# Patient Record
Sex: Female | Born: 1975 | Race: White | Hispanic: Yes | Marital: Single | Smoking: Never smoker
Health system: Southern US, Community
[De-identification: ages and names within clinical notes are randomized; demographics above are authoritative.]

## PROBLEM LIST (undated history)

## (undated) DIAGNOSIS — N200 Calculus of kidney: Secondary | ICD-10-CM

## (undated) DIAGNOSIS — O009 Unspecified ectopic pregnancy without intrauterine pregnancy: Secondary | ICD-10-CM

## (undated) HISTORY — PX: OTHER SURGICAL HISTORY: SHX169

---

## 2001-06-12 ENCOUNTER — Inpatient Hospital Stay (HOSPITAL_COMMUNITY): Admission: AD | Admit: 2001-06-12 | Discharge: 2001-06-13 | Payer: Self-pay | Admitting: Obstetrics and Gynecology

## 2001-06-19 ENCOUNTER — Inpatient Hospital Stay (HOSPITAL_COMMUNITY): Admission: AD | Admit: 2001-06-19 | Discharge: 2001-06-19 | Payer: Self-pay | Admitting: *Deleted

## 2001-06-26 ENCOUNTER — Encounter: Admission: RE | Admit: 2001-06-26 | Discharge: 2001-06-26 | Payer: Self-pay | Admitting: *Deleted

## 2003-08-28 ENCOUNTER — Inpatient Hospital Stay (HOSPITAL_COMMUNITY): Admission: AD | Admit: 2003-08-28 | Discharge: 2003-08-28 | Payer: Self-pay | Admitting: Gynecology

## 2003-11-04 ENCOUNTER — Ambulatory Visit (HOSPITAL_COMMUNITY): Admission: RE | Admit: 2003-11-04 | Discharge: 2003-11-04 | Payer: Self-pay | Admitting: *Deleted

## 2004-03-22 ENCOUNTER — Ambulatory Visit: Payer: Self-pay | Admitting: Family Medicine

## 2004-03-22 ENCOUNTER — Inpatient Hospital Stay (HOSPITAL_COMMUNITY): Admission: AD | Admit: 2004-03-22 | Discharge: 2004-03-24 | Payer: Self-pay | Admitting: *Deleted

## 2014-04-07 ENCOUNTER — Emergency Department (HOSPITAL_COMMUNITY)
Admission: EM | Admit: 2014-04-07 | Discharge: 2014-04-08 | Disposition: A | Discharge status: Home / Self Care | Payer: Self-pay | Attending: Emergency Medicine | Admitting: Emergency Medicine

## 2014-04-07 ENCOUNTER — Encounter (HOSPITAL_COMMUNITY): Payer: Self-pay | Admitting: Emergency Medicine

## 2014-04-07 DIAGNOSIS — Y998 Other external cause status: Secondary | ICD-10-CM | POA: Insufficient documentation

## 2014-04-07 DIAGNOSIS — Y9389 Activity, other specified: Secondary | ICD-10-CM | POA: Insufficient documentation

## 2014-04-07 DIAGNOSIS — Z87442 Personal history of urinary calculi: Secondary | ICD-10-CM | POA: Insufficient documentation

## 2014-04-07 DIAGNOSIS — T17320A Food in larynx causing asphyxiation, initial encounter: Secondary | ICD-10-CM

## 2014-04-07 DIAGNOSIS — T17328A Food in larynx causing other injury, initial encounter: Secondary | ICD-10-CM | POA: Insufficient documentation

## 2014-04-07 DIAGNOSIS — Y9289 Other specified places as the place of occurrence of the external cause: Secondary | ICD-10-CM | POA: Insufficient documentation

## 2014-04-07 DIAGNOSIS — X58XXXA Exposure to other specified factors, initial encounter: Secondary | ICD-10-CM | POA: Insufficient documentation

## 2014-04-07 HISTORY — DX: Calculus of kidney: N20.0

## 2014-04-07 HISTORY — DX: Unspecified ectopic pregnancy without intrauterine pregnancy: O00.90

## 2014-04-07 NOTE — ED Notes (Signed)
Per EMS, patient from home, was choking on a popsicle. Her daughter preformed the heimlich maneuver on patient. Patient ambulatory to triage, NAD distress.

## 2014-04-07 NOTE — ED Notes (Signed)
Pt had an episode today where she choked on a popscicle. Alert, oriented and breathing at present.

## 2014-04-08 NOTE — Discharge Instructions (Signed)
Asfixia  (Choking) La asfixia ocurre cuando un alimento o un objeto se atora en la garganta o en la trquea, obstruyendo la va area. Cuando la va area est parcialmente obstruida, por lo general la tos hace que se desobstruya la comida o el [place]. Si la va area est completamente obstruida, es necesario que se tomen medidas inmediatas para ayudar a que salga. Una obstruccin completa de las vas areas es potencialmente mortal porque puede causar un paro respiratorio. La asfixia es una verdadera emergencia mdica que requiere accin rpida y [place] por cualquier persona disponible.  SIGNOS DE OBSTRUCCIN DE LAS VAS RESPIRATORIAS  Existe una obstruccin parcial en las vas respiratorias si usted o la persona que se est ahogando:   Puede respirar y Electrical engineer.  Toser fuerte.  Hacer ruidos fuertes. Existe una obstruccin completa de las vas respiratorias si usted o la persona que se est ahogando:   No puede respirar.  Hace sonidos suaves o chillones al respirar.  No puede toser o tose dbilmente, ineficazmente o silenciosamente.  No puede llorar, hablar o emitir sonidos.  Se vuelve azul.  Se sostiene el cuello con ambos brazos. Este es el signo universal de [place]. QU HACER EN CASO DE ASFIXIA  Si hay una obstruccin parcial en las vas respiratorias, la tos permite despejarlas. No trate de beber [employer] el alimento o el objeto salga. Si alguien tiene una obstruccin parcial en las vas respiratorias, no interfiera. Permanezca con la persona y vea si hay signos de obstruccin completa de las vas respiratorias hasta que el alimento o el objeto salgan.  Si hay una obstruccin completa o si hay una obstruccin parcial en las vas respiratorias y la comida o el objeto no salen, realice compresiones abdominales (tambin conocida como maniobra de Heimlich). Las compresiones abdominales se utilizan para crear una tos artificial y tratar de [employer] va area. Realizar compresiones  abdominales es parte de una serie de pasos que se deben hacer para ayudar a alguien que se est asfixiando. Las compresiones abdominales las realiza otra persona, pero si usted est solo, puede realizarse compresiones abdominales a s mismo. Siga el procedimiento que se describe a Administrator se adapte a su situacin.  SI OTRA PERSONA SE ESTA ASFIXIANDO: En un adulto consciente:  1. Pregntele si se est ahogando. Si la persona asiente, contine con el paso 2. 2. Pngase de pie o de rodillas detrs de la persona y dblelo hacia delante ligeramente. 3. Haga un puo con 1 mano, ponga sus brazos alrededor de Geologist, engineering, y agarre el puo con la otra mano. Coloque el lado del pulgar de su puo en el abdomen de la persona, justo debajo de las [place]. 4. Presione hacia adentro y [place] con ambas manos. 5. Repita la maniobra hasta que el objeto salga y la persona sea capaz de respirar o hasta que la persona pierda el conocimiento. Para un adulto inconsciente:  1. Grite pidiendo [place]. Si alguien responde, pdale que llame al servicio local de emergencias (911 en los EE.UU.). Si nadie responde, llame inmediatamente usted mismo si es posible. 2. Inicie la RCP, comenzando con las compresiones. Cada vez que abre la va respiratoria para dar respiraciones de rescate, abra la boca de la persona. Si usted puede ver la comida o el objeto y puede extraerlo fcilmente, retrelo con los dedos. 3. Despus de 5 ciclos o 2 minutos de RCP, llame a los servicios locales de emergencia (911 en los EE.[place].) si usted u otra persona  no han llamado todava. Para un adulto consciente que es obeso o en las ltimas etapas del embarazo:  Las compresiones abdominales pueden no ser eficaces para ayudar a las personas que estn en las ltimas etapas del [place] o que son obesas. En estos casos, se pueden utilizar presiones [employer].  1. Pregntele a la persona si se est ahogando. Si la persona que asiente con la  cabeza y tiene signos de obstruccin completa de las vas respiratorias, contine con el paso 2. 2. Prese detrs de la persona y rodee con los brazos su pecho (con los brazos bajo las axilas de Geologist, engineering). 3. Haga un puo con 1 mano. Coloque el lado del pulgar de su puo en el centro del esternn de la persona. 4. Agarre el puo con la otra mano y [employer]. Contine hasta que el objeto salga o hasta que la persona pierda el conocimiento. Para un adulto inconsciente que es obesa o en las ltimas etapas del embarazo:  1. Grite pidiendo [place]. Si alguien responde, pdale que llame al servicio local de emergencias (911 en los EE.[place].). Si nadie responde, llame inmediatamente usted mismo si es posible. 2. Inicie la RCP, comenzando con las compresiones. Cada vez que abre la va respiratoria para dar respiraciones de rescate, abra la boca de la persona. Si usted puede ver la comida o un objeto y puede ser fcilmente arrancado, retrelo con los dedos. 3. Despus de 5 ciclos o 2 minutos de RCP, llame a los servicios locales de emergencia (911 en los [place].) si usted u otra persona no han llamado todava. Tenga en cuenta que, si es posible, se deben hacer las compresiones abdominales (por debajo de la caja torcica) en una mujer embarazada. Esto debe ser posible [employer] ltimas etapas del [place], [place] ya no queda suficiente espacio entre el tero aumentado de tamao y la caja torcica para Statistician. En ese punto, se deben usar las presiones en el [employer] se describe.  SI USTED SE EST ASFIXIANDO: 1. Llame a los servicios locales de emergencia (911 en los EE.[place].), si est cerca de un telfono fijo. No se preocupe por comunicar lo que est sucediendo. No cuelgue el telfono. De todos modos podrn enviarle a alguien para ayudarle. 2. Haga un puo con 1 mano. Coloque el lado del pulgar del puo contra su estmago, justo por encima del ombligo y bien abajo del esternn. Si est embarazada  o es obeso, coloque su puo en el pecho, justo por debajo del esternn y por encima de las costillas inferiores. 3. Sujete el puo con la otra mano e inclnese sobre una superficie dura, como una mesa o una silla. 4. Con fuerza empuje el puo hacia adentro y [place]. 5. Contine haciendo esto [employer] el alimento o el objeto salgan. PREVENCIN  Para estar preparado por si se produce una asfixia, aprenda a realizar correctamente las compresiones abdominales y dar RCP tomando un curso certificado de capacitacin de primeros auxilios.  SOLICITE ATENCIN MDICA DE INMEDIATO SI:   Tiene fiebre despus de atragantarse.  Tiene problemas para respirar despus de atragantarse.  Le aplicaron la maniobra de Heimlich. ASEGRESE DE QUE:  Comprende estas instrucciones.  Controlar su enfermedad.  Solicitar ayuda de inmediato si no mejora o si empeora. Document Released: 12/27/2004 Document Revised: 05/13/2013 [facility] Patient Information 2015 [place], [place]. This information is not intended to replace advice given to you by your health care provider. Make sure you discuss any questions you  have with your health care provider.

## 2014-04-08 NOTE — ED Provider Notes (Signed)
CSN: [id number]     Arrival date & time 04/07/14  2229 History   First MD Initiated Contact with Patient 04/07/14 2337     Chief Complaint  Patient presents with  . Choking     (Consider location/radiation/quality/duration/timing/severity/associated sxs/prior Treatment) HPI Comments: Per daughter, the patient was eating a pop sickle tonight and choked on a frozen piece. The daughter performed the heimlich maneuver and dislodged the food. No syncope. No difficulty swallowing since the accident. No SOB or cough.   The history is provided by the patient and a relative. A language interpreter was used Public house manager is family at bedside.).    Past Medical History  Diagnosis Date  . Nephrolithiasis   . Ectopic pregnancy    History reviewed. No pertinent past surgical history. History reviewed. No pertinent family history. History  Substance Use Topics  . Smoking status: Never Smoker   . Smokeless tobacco: Not on file  . Alcohol Use: No   OB History    No data available     Review of Systems  Constitutional: Negative for fever.  HENT: Negative for sore throat and trouble swallowing.   Respiratory: Negative for cough and shortness of breath.   Cardiovascular: Negative for chest pain.  Gastrointestinal: Negative for nausea.      Allergies  Review of patient's allergies indicates no known allergies.  Home Medications   Prior to Admission medications   Not on File   BP 105/43 mmHg  Pulse 98  Temp(Src) 99.4 F (37.4 C) (Oral)  SpO2 98%  LMP 03/12/2014 (Exact Date) Physical Exam  Constitutional: She is oriented to person, place, and time. She appears well-developed and well-nourished. No distress.  HENT:  Mouth/Throat: Oropharynx is clear and moist.  Eyes: Conjunctivae are normal.  Neck: Normal range of motion.  Pulmonary/Chest: Effort normal. She has no wheezes. She has no rales.  Neurological: She is alert and oriented to person, place, and time.  Skin: Skin is  warm and dry.  No cyanosis    ED Course  Procedures (including critical care time) Labs Review Labs Reviewed - No data to display  Imaging Review No results found.   EKG Interpretation None      MDM   Final diagnoses:  Choking due to food in larynx, initial encounter    Patient at baseline without evidence of aspiration or retained food. Swallowing without difficulty. Stable for discharge.     Charlann Lange, PA-C 04/08/14 0006  Shanon Rosser, MD 04/08/14 [phone]

## 2014-07-09 ENCOUNTER — Ambulatory Visit: Payer: Self-pay | Attending: Internal Medicine | Admitting: Internal Medicine

## 2014-07-09 ENCOUNTER — Encounter: Payer: Self-pay | Admitting: Internal Medicine

## 2014-07-09 VITALS — BP 141/86 | HR 100 | Temp 98.6°F | Resp 16 | Wt 238.4 lb

## 2014-07-09 DIAGNOSIS — H9313 Tinnitus, bilateral: Secondary | ICD-10-CM

## 2014-07-09 LAB — CBC
HCT: 39.7 % (ref 36.0–46.0)
Hemoglobin: 13.4 g/dL (ref 12.0–15.0)
MCH: 28.8 pg (ref 26.0–34.0)
MCHC: 33.8 g/dL (ref 30.0–36.0)
MCV: 85.2 fL (ref 78.0–100.0)
MPV: 11.6 fL (ref 8.6–12.4)
Platelets: 263 10*3/uL (ref 150–400)
RBC: 4.66 MIL/uL (ref 3.87–5.11)
RDW: 15.1 % (ref 11.5–15.5)
WBC: 9.4 10*3/uL (ref 4.0–10.5)

## 2014-07-09 NOTE — Progress Notes (Signed)
  New patient here to established care. She has some ringing in her ears x 1-2 weeks. This has made patient very concerned.

## 2014-07-09 NOTE — Progress Notes (Signed)
Patient ID: Kristin [name], female   DOB: Apr 30, 1975, 39 y.o.   MRN: [id number]  [id number]  [id number]  DOB - 1975/03/18  CC:  Chief Complaint  Patient presents with  . New patient    Ringing in ears       HPI: Kristin [name] is a 39 y.o. female here today to establish medical care.  Patient has no past medical history. She reports ear ringing for 1-2 weeks. She reports that she takes a dialy multi-vitamin and a herbal supplement for assistance with metabolism. She states that she only took the herbal supplement once 2 weeks ago. She notes that she developed a headache 2 weeks ago that only last a couple of hours and after that she noticed tinnitus. She has not had a headache since that once incidence/ She denies otalgia, dizziness, sinus congestion, or vision changes. She also denies stress or anxiety.   She states that back in 2012 she had a physical and was told that she had a "extra flap" in her throat and was told that was the reason why she has a popping nose in her throat when she swallows. She reports that she was told that it was nothing to worrry about but she does feel like she has difficulty swallowing. In February she was seen in ED for choking as well. Some throat irritation since that time.   Patient has No chest pain, No abdominal pain - No Nausea, No new weakness tingling or numbness, No Cough - SOB.  No Known Allergies Past Medical History  Diagnosis Date  . Nephrolithiasis   . Ectopic pregnancy    No current outpatient prescriptions on file prior to visit.   No current facility-administered medications on file prior to visit.   No family history on file. History   Social History  . Marital Status: Single    Spouse Name: N/A  . Number of Children: N/A  . Years of Education: N/A   Occupational History  . Not on file.   Social History Main Topics  . Smoking status: Never Smoker   . Smokeless tobacco: Not on file  . Alcohol Use: No  . Drug Use: No    . Sexual Activity: Not on file   Other Topics Concern  . Not on file   Social History Narrative    Review of Systems: See HPI.    Objective:   Filed Vitals:   07/09/14 1618  BP: 141/86  Pulse: 100  Temp: 98.6 F (37 C)  Resp: 16    Physical Exam: Constitutional: Patient appears well-developed and well-nourished. No distress. HENT: Normocephalic, atraumatic, External right and left ear normal. Oropharynx is clear and moist.  Eyes: Conjunctivae and EOM are normal. PERRLA, no scleral icterus. Neck: Normal ROM. Neck supple. No JVD. No tracheal deviation. No thyromegaly. CVS: RRR, S1/S2 +, no murmurs, no gallops, no carotid bruit.  Pulmonary: Effort and breath sounds normal, no stridor, rhonchi, wheezes, rales.  Abdominal: Soft. BS +, no distension, tenderness, rebound or guarding.  Musculoskeletal: Normal range of motion. No edema and no tenderness.  Lymphadenopathy: No lymphadenopathy noted, cervical, inguinal or axillary Neuro: Alert. Normal reflexes, muscle tone coordination. No cranial nerve deficit. Skin: Skin is warm and dry. No rash noted. Not diaphoretic. No erythema. No pallor. Psychiatric: Normal mood and affect. Behavior, judgment, thought content normal.     Assessment and plan:   Rosene was seen today for new patient.  Diagnoses and all orders for this visit:  Tinnitus, bilateral Orders: -  Basic Metabolic Panel -     CBC -     TSH -     Vitamin D, 25-hydroxy I do not see a clear reason as to why patient has ringing in ears. I will draw labs and call her with results. My thought is it could be due to anxiety. She appeared anxious during exam and interviewing.   Due to language barrier, an interpreter was present during the history-taking and subsequent discussion (and for part of the physical exam) with this patient.   Return if symptoms worsen or fail to improve.    Chari Manning, NP-C [facility] [phone] 07/09/2014,  4:33 PM

## 2014-07-10 LAB — BASIC METABOLIC PANEL
BUN: 15 mg/dL (ref 6–23)
CO2: 26 mEq/L (ref 19–32)
Calcium: 9.3 mg/dL (ref 8.4–10.5)
Chloride: 103 mEq/L (ref 96–112)
Creat: 0.68 mg/dL (ref 0.50–1.10)
Glucose, Bld: 96 mg/dL (ref 70–99)
Potassium: 4.9 mEq/L (ref 3.5–5.3)
Sodium: 141 mEq/L (ref 135–145)

## 2014-07-10 LAB — VITAMIN D 25 HYDROXY (VIT D DEFICIENCY, FRACTURES): Vit D, 25-Hydroxy: 24 ng/mL — ABNORMAL LOW (ref 30–100)

## 2014-07-10 LAB — TSH: TSH: 1.859 u[IU]/mL (ref 0.350–4.500)

## 2014-07-15 ENCOUNTER — Telehealth: Payer: Self-pay

## 2014-07-15 NOTE — Telephone Encounter (Signed)
-----   Message from Lance Bosch, NP sent at 07/14/2014 11:30 AM EDT ----- Labs normal except low vitamin D. She may get a OTC multivitamin to take daily or she may get OTC vitamin D 800 IU to take daily. Vitamin D is essential for bone health

## 2014-07-15 NOTE — Telephone Encounter (Signed)
Nurse called patient, via in house interpreter, [place]. Patient verified date of birth. Patient aware of normal labs except low vitamin D.  Patient aware of getting OTC mulitvitamin or OTC vitamin D 800 IU to take daily. Patient aware of vitamin D being essential for bone health.  Patient voices understanding and has no further questions at this time.

## 2014-08-13 ENCOUNTER — Ambulatory Visit: Payer: Self-pay | Attending: Internal Medicine

## 2015-01-14 ENCOUNTER — Ambulatory Visit: Payer: Medicaid Other | Attending: Internal Medicine

## 2015-06-24 ENCOUNTER — Ambulatory Visit: Payer: Medicaid Other | Attending: Internal Medicine

## 2015-10-12 ENCOUNTER — Emergency Department (HOSPITAL_COMMUNITY)
Admission: EM | Admit: 2015-10-12 | Discharge: 2015-10-12 | Disposition: A | Discharge status: Home / Self Care | Payer: Self-pay | Attending: Physician Assistant | Admitting: Physician Assistant

## 2015-10-12 ENCOUNTER — Emergency Department (HOSPITAL_COMMUNITY): Payer: Self-pay

## 2015-10-12 ENCOUNTER — Encounter (HOSPITAL_COMMUNITY): Payer: Self-pay | Admitting: Emergency Medicine

## 2015-10-12 DIAGNOSIS — M5430 Sciatica, unspecified side: Secondary | ICD-10-CM | POA: Insufficient documentation

## 2015-10-12 LAB — PREGNANCY, URINE: Preg Test, Ur: NEGATIVE

## 2015-10-12 MED ORDER — OXYCODONE-ACETAMINOPHEN 5-325 MG PO TABS: 1.0000 | ORAL_TABLET | Freq: Four times a day (QID) | ORAL | 0 refills | Status: DC | PRN

## 2015-10-12 MED ORDER — OXYCODONE-ACETAMINOPHEN 5-325 MG PO TABS
1.0000 | ORAL_TABLET | Freq: Once | ORAL | Status: AC
  Administered 2015-10-12: 1 via ORAL
  Filled 2015-10-12: qty 1

## 2015-10-12 MED ORDER — ONDANSETRON HCL 4 MG PO TABS
4.0000 mg | ORAL_TABLET | Freq: Once | ORAL | Status: AC
  Administered 2015-10-12: 4 mg via ORAL
  Filled 2015-10-12: qty 1

## 2015-10-12 NOTE — ED Provider Notes (Signed)
[facility] DEPT Provider Note   CSN: [id number] Arrival date & time: 10/12/15  [id number]  By signing my name below, I, Neta Mends, attest that this documentation has been prepared under the direction and in the presence of Livana Yerian Julio Alm, MD . Electronically Signed: Neta Mends, ED Scribe. 10/12/2015. 2:20 AM.    History   Chief Complaint Chief Complaint  Patient presents with  . Back Pain    lower left side radiating to knee      The history is provided by a friend. The history is limited by a language barrier. No language interpreter was used.   HPI Comments:  Kristin [name] is a 40 y.o. female who presents to the Emergency Department complaining of worsening lower back pain x3 weeks. Per friend, pt had been ambulatory, but yesterday had difficulty getting out of bed and walking. Friend states that pain is primarily in lower back on L side, radiates around her L hip and to L knee.  Pt had no fever or difficulty urination, not IVDU.   Past Medical History:  Diagnosis Date  . Ectopic pregnancy   . Nephrolithiasis     There are no active problems to display for this patient.   History reviewed. No pertinent surgical history.  OB History    No data available       Home Medications    Prior to Admission medications   Medication Sig Start Date End Date Taking? Authorizing Provider  ibuprofen (ADVIL) 200 MG tablet Take 800 mg by mouth every 6 (six) hours as needed for moderate pain.   Yes Historical Provider, MD    Family History No family history on file.  Social History Social History  Substance Use Topics  . Smoking status: Never Smoker  . Smokeless tobacco: Not on file  . Alcohol use No     Allergies   Review of patient's allergies indicates no known allergies.   Review of Systems Review of Systems  Musculoskeletal: Positive for back pain and gait problem.  All other systems reviewed and are negative.    Physical  Exam Updated Vital Signs There were no vitals taken for this visit.  Physical Exam  Constitutional: She appears well-developed and well-nourished. No distress.  HENT:  Head: Normocephalic and atraumatic.  Eyes: Conjunctivae are normal.  Cardiovascular: Normal rate.   Pulmonary/Chest: Effort normal.  Abdominal: She exhibits no distension.  Musculoskeletal:  Full ROM of L hip and L knee  Neurological: She is alert.  Skin: Skin is warm and dry.  Psychiatric: She has a normal mood and affect.  Nursing note and vitals reviewed.    ED Treatments / Results  DIAGNOSTIC STUDIES:  Oxygen Saturation is 100% on RA, normal by my interpretation.    COORDINATION OF CARE:  2:20 AM Discussed treatment plan with pt at bedside and pt agreed to plan.   Labs (all labs ordered are listed, but only abnormal results are displayed) Labs Reviewed - No data to display  EKG  EKG Interpretation None       Radiology No results found.  Procedures Procedures (including critical care time)  Medications Ordered in ED Medications - No data to display   Initial Impression / Assessment and Plan / ED Course  I have reviewed the triage vital signs and the nursing notes.  Pertinent labs & imaging results that were available during my care of the patient were reviewed by me and considered in my medical decision making (see chart for details).  Clinical Course    Patient is a 40 year old female presenting with back pain. Patient has no fever, no trouble with urination, no weakness, no IV drug use. No red flags. We'll get plain film. Patient has had this problem in the past and went to  chiropractor which made better. Suspect that patient has sciatica. Physical exam and symptoms are consistent with this. Patient has normal strength and sensation. We will give pain management and have patient follow up with primary care physician, orthopedics as needed.  Final Clinical Impressions(s) / ED Diagnoses    Final diagnoses:  None    New Prescriptions New Prescriptions   No medications on file  I personally performed the services described in this documentation, which was scribed in my presence. The recorded information has been reviewed and is accurate.      Jayshaun Phillips Julio Alm, MD 10/12/15 [phone]

## 2015-10-12 NOTE — ED Notes (Signed)
Bed: WA07 Expected date:  Expected time:  Means of arrival:  Comments: 40 yo F back pain

## 2015-10-12 NOTE — ED Triage Notes (Signed)
Pt comes to ed, c/o pain for past 3 weeks, left side lower back. Radiating to left knee. Pain on bearing pressure.   Hx of chronic back pain,  No reported injury.  Iv located left hand 20.  150 mcg fentanyl by ems, iv bag 150 in.   V/s 138/80, hr 106, rr20, 95 sp02 room air,  CAN NOT SPEAK ENGLISH. DAUGHTER IN ROOM TRANSLATING.

## 2015-10-12 NOTE — Discharge Instructions (Signed)
We are sorry about your back pain. Your x-rays normal. We think this is likely sciatica. Please use the pain medications provided to help you. If you have any weakness fever or other concerns please return to the emergency department

## 2016-01-29 ENCOUNTER — Ambulatory Visit: Payer: Medicaid Other | Attending: Internal Medicine

## 2016-09-14 ENCOUNTER — Ambulatory Visit: Payer: Self-pay | Attending: Internal Medicine

## 2016-12-06 ENCOUNTER — Telehealth: Payer: Self-pay

## 2016-12-06 ENCOUNTER — Encounter: Payer: Self-pay | Admitting: Nurse Practitioner

## 2016-12-06 ENCOUNTER — Ambulatory Visit: Payer: Self-pay | Attending: Nurse Practitioner | Admitting: Nurse Practitioner

## 2016-12-06 VITALS — BP 131/81 | HR 96 | Temp 98.7°F | Resp 14 | Ht 64.0 in | Wt 240.8 lb

## 2016-12-06 DIAGNOSIS — F458 Other somatoform disorders: Secondary | ICD-10-CM | POA: Insufficient documentation

## 2016-12-06 DIAGNOSIS — K089 Disorder of teeth and supporting structures, unspecified: Secondary | ICD-10-CM

## 2016-12-06 DIAGNOSIS — G8929 Other chronic pain: Secondary | ICD-10-CM | POA: Insufficient documentation

## 2016-12-06 DIAGNOSIS — Z791 Long term (current) use of non-steroidal anti-inflammatories (NSAID): Secondary | ICD-10-CM | POA: Insufficient documentation

## 2016-12-06 DIAGNOSIS — M545 Low back pain: Secondary | ICD-10-CM | POA: Insufficient documentation

## 2016-12-06 DIAGNOSIS — H9313 Tinnitus, bilateral: Secondary | ICD-10-CM | POA: Insufficient documentation

## 2016-12-06 DIAGNOSIS — Z79891 Long term (current) use of opiate analgesic: Secondary | ICD-10-CM | POA: Insufficient documentation

## 2016-12-06 DIAGNOSIS — R1310 Dysphagia, unspecified: Secondary | ICD-10-CM

## 2016-12-06 DIAGNOSIS — Z0000 Encounter for general adult medical examination without abnormal findings: Secondary | ICD-10-CM | POA: Insufficient documentation

## 2016-12-06 DIAGNOSIS — Z6841 Body Mass Index (BMI) 40.0 and over, adult: Secondary | ICD-10-CM | POA: Insufficient documentation

## 2016-12-06 LAB — POCT URINALYSIS DIPSTICK
Bilirubin, UA: NEGATIVE
Glucose, UA: NEGATIVE
Ketones, UA: NEGATIVE
Leukocytes, UA: NEGATIVE
Nitrite, UA: NEGATIVE
Protein, UA: NEGATIVE
Spec Grav, UA: <=1.005 — AB (ref 1.010–1.025)
Urobilinogen, UA: 0.2 E.U./dL
pH, UA: 6.5 (ref 5.0–8.0)

## 2016-12-06 NOTE — Telephone Encounter (Signed)
Interpreter #: Cassandria Santee [phone]  Interpreter left voicemail on home phone. There was no voicemail set up on mobile number.  CMA attempt to called patient regarding urinalysis result.   Communication letter will be sent out.

## 2016-12-06 NOTE — Progress Notes (Signed)
Assessment & Plan:  Kristin [name] was seen today for establish care.  Diagnoses and all orders for this visit:  Tinnitus of both ears -     Ambulatory referral to ENT  Poor dentition -     Ambulatory referral to Dentistry  Morbid obesity with BMI of 40.0-44.9, adult ([place]) -     CBC -     CMP14+EGFR -     Lipid panel -     TSH Discussed diet and exercise for person with BMI >25. Instructed: You must burn more calories than you eat. Losing 5 percent of your body weight should be considered a success. In the longer term, losing more than 15 percent of your body weight and staying at this weight is an extremely good result. However, keep in mind that even losing 5 percent of your body weight leads to important health benefits, so try not to get discouraged if you're not able to lose more than this. Will recheck weight in 3-6 months.  Chronic bilateral low back pain without sciatica -     Urinalysis Dipstick Work on losing weight to help reduce back pain. May alternate with heat and ice application for pain relief. May also alternate with acetaminophen and Ibuprofen as prescribed for back pain. Other alternatives include massage, acupuncture and water aerobics.  You must stay active and avoid a sedentary lifestyle.  Dysphagia, unspecified type Ambulatory referral to ENT    Patient has been counseled on age-appropriate routine health concerns for screening and prevention. These are reviewed and up-to-date. Referrals have been placed accordingly. Immunizations are up-to-date or declined.    Subjective:   Chief Complaint  Patient presents with  . Establish Care    Patient is here to establish care. Patient stated that her back hurts from her middle to her lower back. Patient stated that she have surrounding noise on both ears, mostly left ears. Patient stated that she would like a refferal to the dentist for her bad tooth that may cause the sounds in her ears.    HPI Kristin [name] 41 y.o.  female presents to office today with concerns of dysphagia, tinnitus and low back pain. VRI was used to communicate directly with patient for the entire encounter including providing detailed patient instructions.   Dysphagia Patient presents with large sensation in throat when she eats or drinks. The patient describes multiple episode(s) with intermittent onset, located in the high throat and mid neck, and symptoms lasting several minutes. Patient reports nothing has become stuck, but feels a lump sensation when swallowing. Associated with the dysphagia, patient also complains of globus sensation. Patient denies hemoptysis and aspiration.  Patient denies regurgitation of undigested food.    Tinnitus Patient presents with tinnitus. Onset of symptoms was  a few years ago ago with unchanged course since that time. Patient describes the tinnitus as constant located in the bilateral ear however worse on the left. The quality is described as medium range pitch that sounds like hissing and roaring. The pattern is nonpulsatile with an intensity that is medium. Patient describes her level of annoyance as minimally annoying, always aware. Associated symptoms include no hearing loss, pain, dizziness, drainage or recurrent otitis. Family history is negative family history for tinnitus Patient has had a prior evaluation for tinnitus by PCP with negative work up. Patient does not have hearing aids at this time. Previous treatments include none.  Back Pain Patient presents for presents evaluation of low back problems.  Symptoms have been present for  a few years and include pinching sensation. . Initial inciting event: none. Symptoms are worst: morning. Alleviating factors identifiable by patient are "resting". Exacerbating factors identifiable by patient are activity.  Treatments so far initiated by patient: NSAIDS with use sparingly.  Previous lower back problems: yes low back pain.Marland Kitchen Previous workup: Lumbar spine xray  with results: NORMAL. Previous treatments: chiropractor adjustment which provided significant relief of symptoms. She feels her back pain may be related to her kidneys or diabetes. Reports her back pain is worsened when she eats sweet foods and drinks sugary liquids. She states when she attempts to urinate sometimes it takes a long time for her urine stream to start. She does deny abdominal pain, fever, nausea or vomiting.   Review of Systems  Constitutional: Negative for fever, malaise/fatigue and weight loss.  HENT: Positive for tinnitus. Negative for ear discharge, ear pain, hearing loss and nosebleeds.   Eyes: Negative.  Negative for blurred vision, double vision and photophobia.  Respiratory: Negative.  Negative for cough and shortness of breath.   Cardiovascular: Negative.  Negative for chest pain, palpitations and leg swelling.  Gastrointestinal: Negative for abdominal pain, blood in stool, constipation, diarrhea, heartburn, melena, nausea and vomiting.       Dysphagia  Genitourinary:       Urinary retention  Musculoskeletal: Positive for back pain. Negative for myalgias.  Neurological: Negative.  Negative for dizziness, speech change, focal weakness, seizures and headaches.  Endo/Heme/Allergies: Negative for environmental allergies.  Psychiatric/Behavioral: Negative.  Negative for suicidal ideas.      Past Medical History:  Diagnosis Date  . Ectopic pregnancy   . Nephrolithiasis     History reviewed. No pertinent surgical history.  History reviewed. No pertinent family history.  Social History Reviewed with no changes to be made today.   Outpatient Medications Prior to Visit  Medication Sig Dispense Refill  . ibuprofen (ADVIL) 200 MG tablet Take 800 mg by mouth every 6 (six) hours as needed for moderate pain.    Marland Kitchen oxyCODONE-acetaminophen (PERCOCET/ROXICET) 5-325 MG tablet Take 1 tablet by mouth every 6 (six) hours as needed for severe pain. (Patient not taking: Reported on  12/06/2016) 11 tablet 0   No facility-administered medications prior to visit.     No Known Allergies     Objective:    BP 131/81 (BP Location: Left Arm, Patient Position: Sitting, Cuff Size: Normal)   Pulse 96   Temp 98.7 F (37.1 C) (Oral)   Resp 14   Ht '5\' 4"'  (1.626 m)   Wt 240 lb 12.8 oz (109.2 kg)   SpO2 100%   BMI 41.33 kg/m  Wt Readings from Last 3 Encounters:  12/06/16 240 lb 12.8 oz (109.2 kg)  07/09/14 238 lb 6.4 oz (108.1 kg)    Physical Exam  Constitutional: She is oriented to person, place, and time. She appears well-developed and well-nourished. She is cooperative.  HENT:  Head: Normocephalic and atraumatic.  Right Ear: Hearing, tympanic membrane, external ear and ear canal normal.  Left Ear: Hearing, tympanic membrane, external ear and ear canal normal.  Nose: Nose normal.  Mouth/Throat: Uvula is midline. Abnormal dentition. No oropharyngeal exudate, posterior oropharyngeal edema, posterior oropharyngeal erythema or tonsillar abscesses.    Eyes: EOM are normal.  Neck: Normal range of motion.  Cardiovascular: Normal rate, regular rhythm, normal heart sounds and intact distal pulses. Exam reveals no gallop and no friction rub.  No murmur heard. Pulmonary/Chest: Effort normal and breath sounds normal. No tachypnea. No respiratory distress.  She has no decreased breath sounds. She has no wheezes. She has no rhonchi. She has no rales. She exhibits no tenderness.  Abdominal: Soft. Bowel sounds are normal.  Musculoskeletal: Normal range of motion. She exhibits no edema.  Neurological: She is alert and oriented to person, place, and time. Coordination normal.  Skin: Skin is warm and dry.  Psychiatric: She has a normal mood and affect. Her behavior is normal. Judgment and thought content normal.  Nursing note and vitals reviewed.      Patient has been counseled extensively about nutrition and exercise as well as the importance of adherence with medications and  regular follow-up. The patient was given clear instructions to go to ER or return to medical center if symptoms don't improve, worsen or new problems develop. The patient verbalized understanding.   Follow-up: Return in about 8 weeks (around 01/31/2017), or if symptoms worsen or fail to improve, for f/u  Gildardo Pounds, FNP-BC [facility] and [place], [place]   12/06/2016, 1:02 PM

## 2016-12-06 NOTE — Telephone Encounter (Signed)
Interpreter # [id number] Irma  Interpreter attempt to make call to the patient but no answer and no voicemail set up.

## 2016-12-06 NOTE — Patient Instructions (Addendum)
Ejercicios para la espalda (Back Exercises) Si tiene dolor de espalda, haga estos ejercicios 2 o 3veces por da, o como se lo haya indicado el mdico. Cuando el dolor desaparezca, hgalos una vez por da, pero haga ms repeticiones de cada ejercicio. Si no le duele la espalda, haga estos ejercicios una vez por da o como se lo haya indicado el mdico. EJERCICIOS Rodilla al pecho Repita estos pasos 3 o 5veces seguidas con cada pierna: 1. Acustese boca arriba sobre una cama dura o sobre el suelo con las piernas extendidas. 2. Lleve una rodilla al pecho. 3. Mantenga la rodilla contra el pecho. Para lograrlo tmese la rodilla o el muslo. 4. Tire de la rodilla hasta sentir una elongacin suave en la parte baja de la espalda. 5. Mantenga la elongacin durante 10 a 30segundos. 6. Suelte y extienda la pierna lentamente. Inclinacin de la pelvis Repita estos pasos 5 o 10veces seguidas: 1. Acustese boca arriba sobre una cama dura o sobre el suelo con las piernas extendidas. 2. Flexione las rodillas de manera que apunten al techo. Los pies deben estar apoyados en el suelo. 3. Contraiga los msculos de la parte baja del vientre (abdomen) para empujar la zona lumbar contra el suelo. Este movimiento har que el cccix apunte hacia el techo, en lugar de apuntar hacia abajo en direccin a los pies o al suelo. 4. Mantenga esta posicin durante 5 a 10segundos mientras contrae suavemente los msculos y respira con normalidad. El perro y el gato Repita estos pasos hasta que la zona lumbar se curve con ms facilidad: 1. Apoye las palmas de las manos y las rodillas sobre una superficie firme. Las manos deben estar alineadas con los hombros y las rodillas con las caderas. Puede colocarse almohadillas debajo de las rodillas. 2. Deje caer la cabeza y lleve el cccix hacia abajo de modo que apunte en direccin al suelo para que la zona lumbar se arquee como el lomo de un gato asustado. 3. Mantenga esta posicin  durante 5segundos. 4. Lentamente, levante la cabeza y lleve el cccix hacia arriba de modo que apunte en direccin al techo para que la espalda se arquee (hunda) como el lomo de un perro contento. 5. Mantenga esta posicin durante 5segundos. Flexiones de brazos Repita estos pasos 5 o 10veces seguidas: 1. Acustese boca abajo en el suelo. 2. Ponga las manos cerca de la cabeza, separadas aproximadamente al ancho de los hombros. 3. Con la espalda relajada y las caderas apoyadas en el suelo, extienda lentamente los brazos para levantar la mitad superior del cuerpo y elevar los hombros. No use los msculos de la espalda. Para estar ms cmodo, puede cambiar la ubicacin de las manos. 4. Mantenga esta posicin durante 5segundos. 5. Lentamente vuelva a la posicin horizontal. Puentes Repita estos pasos 10veces seguidas: 1. Acustese boca arriba sobre una superficie firme. 2. Flexione las rodillas de manera que apunten al techo. Los pies deben estar apoyados en el suelo. 3. Contraiga los glteos y despegue las nalgas del suelo hasta que la cintura est casi a la altura de las rodillas. Si no siente el trabajo muscular en las nalgas y la parte posterior de los muslos, aleje los pies 1 o 2pulgadas (2,5 o 5centmetros) de las nalgas. 4. Mantenga esta posicin durante 3 a 5segundos. 5. Lentamente, vuelva a apoyar las nalgas en el suelo y relaje los glteos. Si este ejercicio le resulta muy fcil, intente realizarlo con los brazos cruzados sobre el pecho. Abdominales Repita estos pasos 5 o   10veces seguidas: 1. Acustese boca arriba sobre una cama dura o sobre el suelo con las piernas extendidas. 2. Flexione las rodillas de manera que apunten al techo. Los pies deben estar apoyados en el suelo. 3. Cruce los [employer]. 4. Baje levemente el mentn en direccin al pecho, pero no doble el cuello. 5. Contraiga los msculos del abdomen y con lentitud eleve el pecho lo suficiente como para  despegar levemente los omplatos del suelo. 6. Lentamente baje el pecho y la cabeza hasta el suelo. Elevaciones de espalda Repita estos pasos 5 o 10veces seguidas: 1. Acustese boca abajo con los brazos a los costados y apoye la frente en el suelo. 2. Contraiga los msculos de las piernas y los glteos. 3. Lentamente despegue el pecho del suelo mientras mantiene las caderas apoyadas en el suelo. Mantenga la nuca alineada con la curvatura de la espalda. Mire hacia el suelo mientras hace este ejercicio. 4. Mantenga esta posicin durante 3 a 5segundos. 5. Lentamente baje el pecho y el rostro hasta el suelo. SOLICITE AYUDA SI:  El dolor de espalda se vuelve mucho ms intenso cuando hace un ejercicio.  El dolor de espalda no se [place] 2horas despus de [employer] ejercicios. Si tiene alguno de [employer], deje de [employer] ejercicios. No vuelva a hacer los ejercicios a menos que el mdico lo autorice. SOLICITE AYUDA DE INMEDIATO SI:  Siente un dolor sbito y muy intenso en la espalda. Si esto ocurre, deje de [employer]. No vuelva a hacer los ejercicios a menos que el mdico lo autorice. Esta informacin no tiene Marine scientist el consejo del mdico. Asegrese de hacerle al mdico cualquier pregunta que tenga. Document Released: 04/13/2010 Document Revised: 04/20/2015 Document Reviewed: 02/20/2014 Elsevier Interactive Patient Education  2018 [place] en adultos (Back Pain, Adult) El dolor de espalda es muy frecuente. A menudo mejora con el tiempo. La causa del dolor de espalda generalmente no es peligrosa. La [employer] de las personas puede aprender a Administrator, sports de espalda por s mismas. CUIDADOS EN EL HOGAR Controle su dolor de espalda a fin de Recruitment consultant cambio. Las siguientes indicaciones ayudarn a Education officer, community que pueda sentir:  Quarry manager. Comience con caminatas cortas sobre superficies planas si es posible. Trate de  caminar un poco ms [place].  Haga ejercicios con regularidad tal como le indic el mdico. El ejercicio ayuda a que su espalda se cure ms rpidamente. Tambin ayuda a prevenir futuras lesiones al [employer] fuertes y flexibles.  No se siente, conduzca ni permanezca de pie durante ms de 30 minutos.  No permanezca en la cama. Si hace reposo ms de 1 a 2 das, puede demorar su recuperacin.  Sea cuidadoso al inclinarse o levantar un objeto. Use una tcnica apropiada para levantar peso: ? [place]. ? Mantenga el objeto cerca del cuerpo. ? No gire.  Duerma sobre un [employer]. Recustese sobre un costado y [place]. Si se recuesta [employer], coloque una almohada debajo de las rodillas.  Tome los medicamentos solamente como se lo haya indicado el mdico.  Aplique hielo sobre la zona lesionada. ? Ponga el hielo en una bolsa plstica. ? Coloque una [employer] piel y la bolsa de hielo. ? Deje el hielo durante 15minutos, 2 a 3veces por da, durante los primeros 2 o 3das. Despus de eso, puede alternar entre compresas de hielo y Freight forwarder.  Evite sentir  ansiedad o estrs. Encuentre maneras efectivas de lidiar con el estrs, Teacher, English as a foreign language ejercicio.  Mantenga un peso saludable. El peso excesivo ejerce tensin sobre la espalda. SOLICITE AYUDA SI:  Siente dolor que no se alivia con reposo o medicamentos.  Siente cada vez ms dolor que se extiende a las piernas o los glteos.  El dolor no mejora en una semana.  Siente dolor por la noche.  Pierde peso.  Siente escalofros o fiebre. SOLICITE AYUDA DE INMEDIATO SI:  No puede controlar su materia fecal (heces) o el pis (orina).  Siente debilidad en las piernas o los brazos.  Siente prdida de la sensibilidad (adormecimiento) en las piernas o los brazos.  Tiene malestar estomacal (nuseas) o vomita.  Siente dolor de estmago (abdominal).  Siente que se desvanece (se desmaya). Esta  informacin no tiene Marine scientist el consejo del mdico. Asegrese de hacerle al mdico cualquier pregunta que tenga. Document Released: 07/12/2010 Document Revised: 01/17/2014 Document Reviewed: 04/30/2013 Elsevier Interactive Patient Education  2018 [place] de espalda crnico (Chronic Back Pain) Cuando el dolor en la espalda dura ms de 3 meses, se denomina dolor de espalda crnico.Es posible que se desconozca la causa de esta afeccin. Algunas causas comunes son las siguientes:  Holiday representative (enfermedad degenerativa) de los huesos, los ligamentos o los discos de la espalda.  Inflamacin y rigidez en la espalda (artritis). Las personas que sufren dolor de espalda crnico generalmente atraviesan determinados perodos en los que este es ms intenso (episodios de exacerbacin del Social research officer, government). Muchas personas pueden aprender a Financial controller de espalda con el cuidado en Engineer, mining. INSTRUCCIONES PARA EL CUIDADO EN EL HOGAR Est atento a cualquier cambio en los sntomas. Tome estas medidas para Theatre stage manager dolor: Actividad  Evite agacharse y las actividades que agravan el problema.  No permanezca sentado o de pie en el mismo lugar durante largos perodos.  Durante el da, descanse durante lapsos breves. Esto le Best boy. Descansar recostado o de pie suele ser mejor que hacerlo sentado.  Cuando descanse durante perodos ms largos, incorpore alguna [place] o ejercicios de elongacin entre uno y [place]. Esto ayudar a Mining engineer rigidez y Conservation officer, historic buildings.  Realice actividad fsica con regularidad. Pregntele al mdico qu actividades son seguras para usted.  No levante ningn objeto que pese ms de 10libras (4,5kg). Siempre use las tcnicas correctas para levantar objetos, entre ellas: ? Flexionar las rodillas. ? Mantener la carga cerca del cuerpo. ? No torcerse. Control del dolor  Si se lo indican, aplique hielo sobre la zona dolorida. El mdico puede recomendarle  que se aplique hielo durante las primeras 24a 48horas despus del comienzo de un episodio de exacerbacin del dolor. ? Ponga el hielo en una bolsa plstica. ? Coloque una [employer] piel y la bolsa de hielo. ? Coloque el hielo durante 71minutos, 2 a 3veces por da.  Despus del hielo, aplquese calor sobre la zona afectada con la frecuencia que le haya indicado el mdico. Use la fuente de calor que el mdico le recomiende, como una compresa de calor hmedo o una almohadilla trmica. ? Coloque una [employer] piel y la fuente de Freight forwarder. ? Aplique el calor durante 20 o 77minutos. ? Retire el calor si la piel se le pone de color rojo brillante. Esto es muy importante si no puede sentir el dolor, el calor o el fro. Puede correr un riesgo mayor de sufrir quemaduras.  Intente tomar un bao de inmersin con  agua caliente.  Tome los medicamentos de venta libre y los recetados solamente como se lo haya indicado el mdico.  Consulting civil engineer a todas las visitas de control como se lo haya indicado el mdico. Esto es importante. SOLICITE ATENCIN MDICA SI:  Siente un dolor que no se alivia con reposo o medicamentos. SOLICITE ATENCIN MDICA DE INMEDIATO SI:  Siente debilidad o adormecimiento en una o ambas piernas, o en uno o ambos pies.  Tiene dificultad para controlar la miccin o la defecacin.  Tiene nuseas o vmitos.  Siente dolor en el abdomen.  Le falta el aire o se desmaya. Esta informacin no tiene Marine scientist el consejo del mdico. Asegrese de hacerle al mdico cualquier pregunta que tenga. Document Released: 12/27/2004 Document Revised: 04/20/2015 Document Reviewed: 06/16/2014 Elsevier Interactive Patient Education  2018 [employer].

## 2016-12-06 NOTE — Telephone Encounter (Signed)
-----   Message from Gildardo Pounds, NP sent at 12/06/2016  1:23 PM EST ----- UA is negative for UTI. Awaiting additional lab results for kidney function, lipids, electrolytes and anemia panel.

## 2016-12-07 ENCOUNTER — Telehealth: Payer: Self-pay

## 2016-12-07 LAB — CMP14+EGFR
ALT: 18 IU/L (ref 0–32)
AST: 20 IU/L (ref 0–40)
Albumin/Globulin Ratio: 1.4 (ref 1.2–2.2)
Albumin: 4.6 g/dL (ref 3.5–5.5)
Alkaline Phosphatase: 89 IU/L (ref 39–117)
BUN/Creatinine Ratio: 17 (ref 9–23)
BUN: 11 mg/dL (ref 6–24)
Bilirubin Total: 0.3 mg/dL (ref 0.0–1.2)
CO2: 25 mmol/L (ref 20–29)
Calcium: 9.4 mg/dL (ref 8.7–10.2)
Chloride: 101 mmol/L (ref 96–106)
Creatinine, Ser: 0.63 mg/dL (ref 0.57–1.00)
GFR calc Af Amer: 128 mL/min/{1.73_m2} (ref >59)
GFR calc non Af Amer: 111 mL/min/{1.73_m2} (ref >59)
Globulin, Total: 3.2 g/dL (ref 1.5–4.5)
Glucose: 98 mg/dL (ref 65–99)
Potassium: 4.3 mmol/L (ref 3.5–5.2)
Sodium: 140 mmol/L (ref 134–144)
Total Protein: 7.8 g/dL (ref 6.0–8.5)

## 2016-12-07 LAB — LIPID PANEL
Chol/HDL Ratio: 5.2 ratio — ABNORMAL HIGH (ref 0.0–4.4)
Cholesterol, Total: 246 mg/dL — ABNORMAL HIGH (ref 100–199)
HDL: 47 mg/dL (ref >39)
LDL Calculated: 178 mg/dL — ABNORMAL HIGH (ref 0–99)
Triglycerides: 103 mg/dL (ref 0–149)
VLDL Cholesterol Cal: 21 mg/dL (ref 5–40)

## 2016-12-07 LAB — CBC
Hematocrit: 43.3 % (ref 34.0–46.6)
Hemoglobin: 14.9 g/dL (ref 11.1–15.9)
MCH: 30.2 pg (ref 26.6–33.0)
MCHC: 34.4 g/dL (ref 31.5–35.7)
MCV: 88 fL (ref 79–97)
Platelets: 289 10*3/uL (ref 150–379)
RBC: 4.94 x10E6/uL (ref 3.77–5.28)
RDW: 14.1 % (ref 12.3–15.4)
WBC: 9.2 10*3/uL (ref 3.4–10.8)

## 2016-12-07 LAB — TSH: TSH: 3.01 u[IU]/mL (ref 0.450–4.500)

## 2016-12-07 NOTE — Telephone Encounter (Signed)
-----   Message from Gildardo Pounds, NP sent at 12/07/2016  9:09 AM EST ----- Her labs are essentially normal aside from her lipid panel. Test shows increased total cholesterol and ldl levels. At this time as these are not fasting numbers I would like for her to work on a low fat, low cholesterol, heart healthy diet and participate in regular aerobic exercise program to control as well. She should go out and walk at least 30 minutes 5 days a week.

## 2016-12-07 NOTE — Telephone Encounter (Signed)
Interpreter #: [name]

## 2016-12-08 NOTE — Telephone Encounter (Signed)
Interpreter 3: Hallie [id number]  CMA attempt to call patient regarding lab result. Patient did not pick up and CMA left a message for patient to call back.    Communication letter will be sent out.

## 2016-12-08 NOTE — Telephone Encounter (Signed)
-----   Message from Gildardo Pounds, NP sent at 12/07/2016  9:09 AM EST ----- Her labs are essentially normal aside from her lipid panel. Test shows increased total cholesterol and ldl levels. At this time as these are not fasting numbers I would like for her to work on a low fat, low cholesterol, heart healthy diet and participate in regular aerobic exercise program to control as well. She should go out and walk at least 30 minutes 5 days a week.

## 2016-12-09 NOTE — Telephone Encounter (Signed)
-----   Message from Gildardo Pounds, NP sent at 12/07/2016  9:09 AM EST ----- Her labs are essentially normal aside from her lipid panel. Test shows increased total cholesterol and ldl levels. At this time as these are not fasting numbers I would like for her to work on a low fat, low cholesterol, heart healthy diet and participate in regular aerobic exercise program to control as well. She should go out and walk at least 30 minutes 5 days a week.

## 2017-01-12 ENCOUNTER — Ambulatory Visit (INDEPENDENT_AMBULATORY_CARE_PROVIDER_SITE_OTHER): Payer: Self-pay | Admitting: Otolaryngology

## 2017-01-12 DIAGNOSIS — R1312 Dysphagia, oropharyngeal phase: Secondary | ICD-10-CM

## 2017-01-12 DIAGNOSIS — H903 Sensorineural hearing loss, bilateral: Secondary | ICD-10-CM

## 2017-01-12 DIAGNOSIS — H9313 Tinnitus, bilateral: Secondary | ICD-10-CM

## 2017-01-12 DIAGNOSIS — K219 Gastro-esophageal reflux disease without esophagitis: Secondary | ICD-10-CM

## 2017-01-16 ENCOUNTER — Other Ambulatory Visit (INDEPENDENT_AMBULATORY_CARE_PROVIDER_SITE_OTHER): Payer: Self-pay | Admitting: Otolaryngology

## 2017-01-16 DIAGNOSIS — R1310 Dysphagia, unspecified: Secondary | ICD-10-CM

## 2017-01-25 ENCOUNTER — Ambulatory Visit (HOSPITAL_COMMUNITY): Payer: Self-pay

## 2017-02-01 ENCOUNTER — Ambulatory Visit: Payer: Self-pay | Admitting: Nurse Practitioner

## 2017-02-08 ENCOUNTER — Ambulatory Visit (HOSPITAL_COMMUNITY)
Admission: RE | Admit: 2017-02-08 | Discharge: 2017-02-08 | Disposition: A | Discharge status: Home / Self Care | Payer: Self-pay | Source: Ambulatory Visit | Attending: Otolaryngology | Admitting: Otolaryngology

## 2017-02-08 DIAGNOSIS — K210 Gastro-esophageal reflux disease with esophagitis: Secondary | ICD-10-CM | POA: Insufficient documentation

## 2017-02-08 DIAGNOSIS — R1310 Dysphagia, unspecified: Secondary | ICD-10-CM | POA: Insufficient documentation

## 2017-02-08 DIAGNOSIS — K224 Dyskinesia of esophagus: Secondary | ICD-10-CM | POA: Insufficient documentation

## 2017-02-22 ENCOUNTER — Ambulatory Visit: Payer: Self-pay | Attending: Nurse Practitioner | Admitting: Nurse Practitioner

## 2017-02-22 ENCOUNTER — Encounter: Payer: Self-pay | Admitting: Nurse Practitioner

## 2017-02-22 VITALS — BP 128/85 | HR 86 | Temp 98.6°F | Ht 64.0 in | Wt 242.8 lb

## 2017-02-22 DIAGNOSIS — Z87442 Personal history of urinary calculi: Secondary | ICD-10-CM | POA: Insufficient documentation

## 2017-02-22 DIAGNOSIS — K219 Gastro-esophageal reflux disease without esophagitis: Secondary | ICD-10-CM | POA: Insufficient documentation

## 2017-02-22 DIAGNOSIS — Z79899 Other long term (current) drug therapy: Secondary | ICD-10-CM | POA: Insufficient documentation

## 2017-02-22 DIAGNOSIS — Z6841 Body Mass Index (BMI) 40.0 and over, adult: Secondary | ICD-10-CM | POA: Insufficient documentation

## 2017-02-22 NOTE — Progress Notes (Signed)
Assessment & Plan:  Kristin [name] was seen today for follow-up.  Diagnoses and all orders for this visit:  Gastroesophageal reflux disease, esophagitis presence not specified Continue Omeprazole as prescribed. Patient will follow up with ENT as instructed   Morbid obesity with BMI of 40.0-44.9, adult ([place]) Discussed diet and exercise for person with BMI >25. Instructed: You must burn more calories than you eat. Losing 5 percent of your body weight should be considered a success. In the longer term, losing more than 15 percent of your body weight and staying at this weight is an extremely good result. However, keep in mind that even losing 5 percent of your body weight leads to important health benefits, so try not to get discouraged if you're not able to lose more than this. I instructed Kristin [name] that weight loss may be very beneficial in helping to reduce some of her GI symptoms     Patient has been counseled on age-appropriate routine health concerns for screening. These are reviewed and up-to-date. Referrals have been placed accordingly. Immunizations are up-to-date or declined.    Subjective:   Chief Complaint  Patient presents with  . Follow-up    Patient is here for a follow-up. Patient would like to have her thyroid check out.    HPI Kristin [name] 42 y.o. female presents to office today with concerns of swallowing difficulties and wanting to have her thyroid checked. I instructed her that I had already ordered a TSH a few months ago and it was normal. She is currently being followed by ENT for GERD and is taking OTC Prilosec 20 mg once a day. She is asking me today for her Esophageal study results. States she was told by Radiology to follow up with her provider for results.  I did tell her she needs to follow up with ENT as the study was ordered by Dr. Benjamine [name] however the imaging does show GERD and she needs to continue taking her omeprazole as prescribed. She is still concerned that there  is something "stuck" in her throat that she can feel when she swallows. She is also worried that her tonsils are too large and this may be contributing to her swallowing difficulties. Her weight has not been affected by her GI issues. Actually her weight is up 4 lbs.   I have called the ENT office and they have informed me that they will contact the patient regarding her esophageal study results. She also is supposed to make a follow up appointment.   Review of Systems  Constitutional: Negative for fever, malaise/fatigue and weight loss.  Respiratory: Negative.  Negative for cough and shortness of breath.   Cardiovascular: Negative.  Negative for chest pain, palpitations and leg swelling.  Gastrointestinal: Positive for heartburn. Negative for abdominal pain, constipation, diarrhea, nausea and vomiting.       SEE HPI  Musculoskeletal: Positive for back pain. Negative for myalgias.  Neurological: Negative.  Negative for dizziness, focal weakness, seizures and headaches.  Endo/Heme/Allergies: Negative for environmental allergies.  Psychiatric/Behavioral: Negative.  Negative for suicidal ideas.    Past Medical History:  Diagnosis Date  . Ectopic pregnancy   . Nephrolithiasis     History reviewed. No pertinent surgical history.  History reviewed. No pertinent family history.  Social History Reviewed with no changes to be made today.   Outpatient Medications Prior to Visit  Medication Sig Dispense Refill  . ibuprofen (ADVIL) 200 MG tablet Take 800 mg by mouth every 6 (six) hours as needed  for moderate pain.    Marland Kitchen omeprazole (PRILOSEC) 20 MG capsule Take 20 mg by mouth daily.     No facility-administered medications prior to visit.     No Known Allergies     Objective:    BP 128/85 (BP Location: Left Arm, Patient Position: Sitting, Cuff Size: Normal)   Pulse 86   Temp 98.6 F (37 C) (Oral)   Ht 5\' 4"  (1.626 m)   Wt 242 lb 12.8 oz (110.1 kg)   LMP 02/18/2017   SpO2 99%   BMI  41.68 kg/m  Wt Readings from Last 3 Encounters:  02/22/17 242 lb 12.8 oz (110.1 kg)  12/06/16 240 lb 12.8 oz (109.2 kg)  07/09/14 238 lb 6.4 oz (108.1 kg)    Physical Exam  Constitutional: She is oriented to person, place, and time. She appears well-developed and well-nourished. She is cooperative.  HENT:  Head: Normocephalic and atraumatic.  Eyes: EOM are normal.  Neck: Normal range of motion. No thyromegaly present.  Cardiovascular: Normal rate, regular rhythm, normal heart sounds and intact distal pulses. Exam reveals no gallop and no friction rub.  No murmur heard. Pulmonary/Chest: Effort normal and breath sounds normal. No tachypnea. No respiratory distress. She has no decreased breath sounds. She has no wheezes. She has no rhonchi. She has no rales. She exhibits no tenderness.  Abdominal: Soft. Bowel sounds are normal.  Musculoskeletal: Normal range of motion. She exhibits no edema.  Lymphadenopathy:    She has no cervical adenopathy.  Neurological: She is alert and oriented to person, place, and time. Coordination normal.  Skin: Skin is warm and dry.  Psychiatric: She has a normal mood and affect. Her behavior is normal. Judgment and thought content normal.  Nursing note and vitals reviewed.      Patient has been counseled extensively about nutrition and exercise as well as the importance of adherence with medications and regular follow-up. The patient was given clear instructions to go to ER or return to medical center if symptoms don't improve, worsen or new problems develop. The patient verbalized understanding.   Follow-up: Return if symptoms worsen or fail to improve.   Gildardo Pounds, FNP-BC [facility] and [address], [place]   02/26/2017, 6:58 PM

## 2017-02-22 NOTE — Patient Instructions (Addendum)
Dieta suave (Bland Diet) La dieta suave se compone de alimentos que no contienen [place]. Para el cuerpo, es ms fcil digerir los alimentos con bajo contenido de [place] o de [place]. Adems, es menos probable que estos causen [employer] boca, la garganta, el estmago y otras partes del tubo digestivo. A menudo, se conoce a la dieta suave como dieta BRAT (por sus siglas en ingls). EN QU CONSISTE EL PLAN? El mdico o el nutricionista pueden recomendar cambios especficos en la dieta para evitar y tratar los sntomas, por ejemplo:  Consumir pequeas cantidades de comida con frecuencia.  Cocinar los alimentos hasta que estn lo bastante blandos para masticarlos con facilidad.  Masticar bien la comida.  Beber lquidos lentamente.  No consumir alimentos muy picantes, cidos o grasosos.  No comer frutas ctricas, como naranjas y pomelos. QU DEBO SABER ACERCA DE ESTA [place]?  Consuma diferentes alimentos de lista de alimentos de la dieta [place].  No siga una dieta suave durante ms tiempo del [place].  Pregntele al mdico si debe tomar vitaminas. QU ALIMENTOS PUEDO COMER? Cereales Cereales calientes, como crema de trigo. Panes, galletas o tortillas elaborados con harina blanca refinada. Arroz. Verduras Verduras cocidas o enlatadas. Pur de papas o papas hervidas. Lambert Mody Bananas. Pur de [employer]. Otros tipos de frutas cocidas o enlatadas peladas y sin semillas, por ejemplo, duraznos o peras en lata. Carnes y otras fuentes de protenas [place]os revueltos. Odell de man cremosa u otras mantequillas de frutos secos. Carnes [employer] cocidas, como ave o pescado. Tofu. Sopas o caldos. Lcteos Productos lcteos sin grasa, como [place], queso cottage y Estate agent. [employer]. T de hierbas. Jugo de [place]. Dulces y postres Pudin. Natillas. Gelatina de frutas. Helados. Grasas y aceites Aderezos suaves para ensaladas. Aceite de canola o de oliva. Esta no es [employer] de los alimentos o las bebidas permitidos. Comunquese con el nutricionista para conocer ms opciones. QU ALIMENTOS NO SE RECOMIENDAN? Los alimentos y los ingredientes que frecuentemente no se recomiendan incluyen los siguientes:  Alimentos picantes, como salsas picantes.  Comidas fritas.  Alimentos cidos, como encurtidos o alimentos fermentados.  Verduras o frutas crudas, especialmente ctricos o frutos del bosque.  Bebidas que contengan cafena.  Alcohol.  Aderezos o condimentos muy saborizados. Es posible que los productos que se enumeran ms arriba no sean una lista completa de los alimentos y las bebidas que no estn permitidos. Comunquese con el nutricionista para obtener ms informacin. Esta informacin no tiene Marine scientist el consejo del mdico. Asegrese de hacerle al mdico cualquier pregunta que tenga. Document Released: 04/20/2015 Document Revised: 04/20/2015 Document Reviewed: 01/08/2014 Elsevier Interactive Patient Education  2018 [place] de alimentos para pacientes con reflujo gastroesofgico - Adultos (Food Choices for Gastroesophageal Reflux Disease, Adult) Cuando se tiene reflujo gastroesofgico (ERGE), los alimentos que se ingieren y los hbitos de alimentacin son Theatre stage manager. Elegir los alimentos adecuados puede ayudar a [employer]. [place]?  Elija las frutas, los vegetales, los cereales integrales y los productos lcteos con bajo contenido de [place].  Soso, de pescado y de ave con bajo contenido de grasas.  Limite las grasas, como los [place], los aderezos para [place], la [place], los frutos secos y Publishing copy.  Lleve un registro de alimentos. Esto ayuda a identificar los alimentos que ocasionan sntomas.  Evite los alimentos que le ocasionen sntomas. Pueden ser distintos para cada persona.  Haga comidas pequeas durante el  TEFL teacher de 3 comidas  abundantes.  Coma lentamente, en un lugar donde est distendido.  Limite el consumo de alimentos fritos.  Cocine los alimentos utilizando mtodos que no sean la fritura.  Evite el consumo alcohol.  Evite beber grandes cantidades de lquidos con las comidas.  Evite agacharse o recostarse hasta despus de 2 o 3horas de haber comido.  QU ALIMENTOS NO SE RECOMIENDAN? Estos son algunos alimentos y bebidas que pueden empeorar los sntomas: Astronomer. Jugo de tomate. Salsa de tomate y espagueti. Ajes. [place] y [place]. Rbano picante. Frutas Naranjas, pomelos y limn (fruta y [place]). Carnes Carnes de [place], de pescado y de ave con gran contenido de grasas. Esto incluye los perros calientes, las [place], el [place], la salchicha, el salame y el tocino. Lcteos Leche entera y [place]. Rite Aid. Crema. [place]. Helados. Queso crema. Bebidas T o caf. Bebidas gaseosas o bebidas energizantes. Condimentos Salsa picante. Salsa barbacoa. Dulces/postres Chocolate y cacao. Ros[place]. Menta y mentol. Grasas y Albertson's. Esto incluye las papas fritas. Otros [place]re. Especias picantes. Esto incluye la pimienta negra, la pimienta blanca, la pimienta roja, la pimienta de cayena, el curry en polvo, los clavos de [place], el jengibre y el [place] en polvo. Esta no es [employer] de los alimentos y las bebidas que se Higher education careers adviser. Comunquese con el nutricionista para recibir ms informacin. Esta informacin no tiene Marine scientist el consejo del mdico. Asegrese de hacerle al mdico cualquier pregunta que tenga. Document Released: 06/28/2011 Document Revised: 01/17/2014 Document Reviewed: 10/31/2012 Elsevier Interactive Patient Education  2017 [place] de alimentos para pacientes con reflujo gastroesofgico - Adultos (Food Choices for Gastroesophageal Reflux Disease, Adult) Cuando se tiene reflujo gastroesofgico (ERGE), los  alimentos que se ingieren y los hbitos de alimentacin son Theatre stage manager. Elegir los alimentos adecuados puede ayudar a [employer]. [place]?  Elija las frutas, los vegetales, los cereales integrales y los productos lcteos con bajo contenido de [place].  [place], de pescado y de ave con bajo contenido de grasas.  Limite las grasas, como los [place], los aderezos para [place], la [place], los frutos secos y Publishing copy.  Lleve un registro de alimentos. Esto ayuda a identificar los alimentos que ocasionan sntomas.  Evite los alimentos que le ocasionen sntomas. Pueden ser distintos para cada persona.  Haga comidas pequeas durante Psychiatrist de 3 comidas abundantes.  Coma lentamente, en un lugar donde est distendido.  Limite el consumo de alimentos fritos.  Cocine los alimentos utilizando mtodos que no sean la fritura.  Evite el consumo alcohol.  Evite beber grandes cantidades de lquidos con las comidas.  Evite agacharse o recostarse hasta despus de 2 o 3horas de haber comido.  QU ALIMENTOS NO SE RECOMIENDAN? Estos son algunos alimentos y bebidas que pueden empeorar los sntomas: Astronomer. Jugo de tomate. Salsa de tomate y espagueti. Ajes. Cebolla y [place]. Rbano picante. Frutas Naranjas, pomelos y limn (fruta y [place]). Carnes Carnes de [place], de pescado y de ave con gran contenido de grasas. Esto incluye los perros calientes, las [place], el [place], la salchicha, el salame y el tocino. Lcteos Leche entera y [place]. Rite Aid. Crema. [place]. Helados. Queso crema. Bebidas T o caf. Bebidas gaseosas o bebidas energizantes. Condimentos Salsa picante. Salsa barbacoa. Dulces/postres Chocolate y cacao. Rosquillas. Menta y mentol. Grasas y Albertson's. Esto incluye las papas fritas. Otros Vinagre. Especias picantes.  Esto incluye la pimienta negra, la pimienta blanca, la  pimienta roja, la pimienta de cayena, el curry en polvo, los clavos de [place], el jengibre y el [place] en polvo. Esta no es [employer] Company de los alimentos y las bebidas que se Higher education careers adviser. Comunquese con el nutricionista para recibir ms informacin. Esta informacin no tiene Marine scientist el consejo del mdico. Asegrese de hacerle al mdico cualquier pregunta que tenga. Document Released: 06/28/2011 Document Revised: 01/17/2014 Document Reviewed: 10/31/2012 Elsevier Interactive Patient Education  2017 [employer].

## 2017-02-26 ENCOUNTER — Encounter: Payer: Self-pay | Admitting: Nurse Practitioner

## 2017-03-01 ENCOUNTER — Ambulatory Visit: Payer: Self-pay | Attending: Nurse Practitioner

## 2017-04-25 ENCOUNTER — Telehealth: Payer: Self-pay | Admitting: Nurse Practitioner

## 2017-04-25 NOTE — Telephone Encounter (Signed)
Patient called requesting DG esophogram results, pt was advised PCP would give results to patient. Pt had test done 02/08/17 and is worried about results. Pt states she does not have a f/up with Otolaryngology Dr Leta Baptist who placed order for test.

## 2017-04-26 NOTE — Telephone Encounter (Signed)
Patient needs to call the provider's office that ordered the esophagram and ask for results. I do not give results of other provider's tests.

## 2017-04-26 NOTE — Telephone Encounter (Signed)
Will route to PCP 

## 2017-05-01 NOTE — Telephone Encounter (Signed)
CMA attempt to call patient to inform PCP cannot give out results from other PCP office. No answer and left a message for patient w/ spanish interpreter Hennie Duos [phone]. CMA left a message containing to call Dr. Benjamine Mola Su office for her results.  Dr. Benjamine Mola Su [phone]

## 2017-07-10 ENCOUNTER — Ambulatory Visit (INDEPENDENT_AMBULATORY_CARE_PROVIDER_SITE_OTHER): Payer: Self-pay | Admitting: Otolaryngology

## 2017-10-04 ENCOUNTER — Ambulatory Visit: Payer: Self-pay | Attending: Nurse Practitioner

## 2017-10-18 ENCOUNTER — Ambulatory Visit: Payer: Self-pay | Attending: Nurse Practitioner | Admitting: Nurse Practitioner

## 2017-10-18 ENCOUNTER — Encounter: Payer: Self-pay | Admitting: Nurse Practitioner

## 2017-10-18 VITALS — BP 125/86 | HR 101 | Temp 99.1°F | Ht 64.0 in | Wt 245.0 lb

## 2017-10-18 DIAGNOSIS — E559 Vitamin D deficiency, unspecified: Secondary | ICD-10-CM | POA: Insufficient documentation

## 2017-10-18 DIAGNOSIS — Z79899 Other long term (current) drug therapy: Secondary | ICD-10-CM | POA: Insufficient documentation

## 2017-10-18 DIAGNOSIS — N898 Other specified noninflammatory disorders of vagina: Secondary | ICD-10-CM | POA: Insufficient documentation

## 2017-10-18 DIAGNOSIS — Z01419 Encounter for gynecological examination (general) (routine) without abnormal findings: Secondary | ICD-10-CM | POA: Insufficient documentation

## 2017-10-18 DIAGNOSIS — Z124 Encounter for screening for malignant neoplasm of cervix: Secondary | ICD-10-CM

## 2017-10-18 NOTE — Progress Notes (Signed)
Assessment & Plan:  Kristin [name] was seen today for annual exam.  Diagnoses and all orders for this visit:  Well woman exam with routine gynecological exam -     Ambulatory referral to Gynecology -     CBC -     CMP14+EGFR -     Lipid panel Referral placed to [facility] for mammogram.  Encounter for Papanicolaou smear for cervical cancer screening -     Cytology - PAP -     US PELVIS (TRANSABDOMINAL ONLY); Future -     US PELVIS TRANSVANGINAL NON-OB (TV ONLY); Future  Vaginal lesion -     HSV Type I/II IgG, IgMw/ reflex  Vitamin D deficiency -     VITAMIN D 25 Hydroxy (Vit-D Deficiency, Fractures)     Patient has been counseled on age-appropriate routine health concerns for screening and prevention. These are reviewed and up-to-date. Referrals have been placed accordingly. Immunizations are up-to-date or declined.    Subjective:   Chief Complaint  Patient presents with  . Annual Exam    Pt. is here for a physical.    HPI Kristin [name] 42 y.o. female presents to office today for annual physical and PAP smear. VRI was used to communicate directly with patient for the entire encounter including providing detailed patient instructions. She has complaints today of hair thinning and vaginal lesions. The vaginal lesions have been present "for years".  She states she has been told in the past from multiple providers that the lesions were benign. She will sometimes squeeze the lesions like a pimple and states the contents will look like toothpaste or sometimes the color of the drainage is yellow.      Review of Systems  Constitutional: Negative.  Negative for chills, fever, malaise/fatigue and weight loss.       Hair thinning  HENT: Negative.  Negative for congestion, hearing loss, sinus pain and sore throat.   Eyes: Negative.  Negative for blurred vision, double vision, photophobia and pain.  Respiratory: Negative.  Negative for cough, sputum production, shortness of breath and wheezing.    Cardiovascular: Negative.  Negative for chest pain and leg swelling.  Gastrointestinal: Positive for abdominal pain. Negative for constipation, diarrhea, heartburn, nausea and vomiting.  Genitourinary:       Vaginal lesions  Musculoskeletal: Positive for back pain (low back pain; currently seeing a chiropractor). Negative for joint pain and myalgias.  Skin: Negative.  Negative for rash.  Neurological: Negative.  Negative for dizziness, tremors, speech change, focal weakness, seizures and headaches.  Endo/Heme/Allergies: Negative.  Negative for environmental allergies.  Psychiatric/Behavioral: Negative.  Negative for depression and suicidal ideas. The patient is not nervous/anxious and does not have insomnia.     Past Medical History:  Diagnosis Date  . Ectopic pregnancy   . Nephrolithiasis     History reviewed. No pertinent surgical history.  History reviewed. No pertinent family history.  Social History Reviewed with no changes to be made today.   Outpatient Medications Prior to Visit  Medication Sig Dispense Refill  . Calcium Carbonate Antacid (TUMS PO) Take by mouth.    Marland Kitchen ibuprofen (ADVIL) 200 MG tablet Take 800 mg by mouth every 6 (six) hours as needed for moderate pain.    Marland Kitchen omeprazole (PRILOSEC) 20 MG capsule Take 20 mg by mouth daily.     No facility-administered medications prior to visit.     No Known Allergies     Objective:    BP 125/86 (BP Location: Left Arm, Patient  Position: Sitting, Cuff Size: Large)   Pulse (!) 101   Temp 99.1 F (37.3 C) (Oral)   Ht '5\' 4"'  (1.626 m)   Wt 245 lb (111.1 kg)   LMP 09/27/2017   SpO2 98%   BMI 42.05 kg/m  Wt Readings from Last 3 Encounters:  10/18/17 245 lb (111.1 kg)  02/22/17 242 lb 12.8 oz (110.1 kg)  12/06/16 240 lb 12.8 oz (109.2 kg)    Physical Exam  Constitutional: She is oriented to person, place, and time. She appears well-developed and well-nourished. No distress.  HENT:  Head: Normocephalic and  atraumatic.  Right Ear: Hearing, tympanic membrane, external ear and ear canal normal.  Left Ear: Hearing, tympanic membrane, external ear and ear canal normal.  Nose: Mucosal edema present.  Mouth/Throat: Uvula is midline, oropharynx is clear and moist and mucous membranes are normal. No oral lesions. No dental abscesses. No oropharyngeal exudate. Tonsils are 2+ on the right. Tonsils are 2+ on the left.  Eyes: Pupils are equal, round, and reactive to light. Conjunctivae and EOM are normal. Right eye exhibits no discharge. Left eye exhibits no discharge. No scleral icterus.  Neck: Trachea normal and normal range of motion. Neck supple. No tracheal deviation present. No thyroid mass and no thyromegaly present.  Cardiovascular: Normal rate, regular rhythm, normal heart sounds and intact distal pulses. Exam reveals no friction rub.  No murmur heard. Pulmonary/Chest: Effort normal and breath sounds normal. No respiratory distress. She has no decreased breath sounds. She has no wheezes. She has no rhonchi. She has no rales. She exhibits no tenderness. Right breast exhibits no inverted nipple, no mass, no nipple discharge, no skin change and no tenderness. Left breast exhibits no inverted nipple, no mass, no nipple discharge, no skin change and no tenderness. No breast swelling, tenderness, discharge or bleeding. Breasts are symmetrical.  Abdominal: Soft. Bowel sounds are normal. She exhibits no distension and no mass. There is no tenderness. There is no rebound and no guarding. Hernia confirmed negative in the right inguinal area and confirmed negative in the left inguinal area.  Genitourinary: Rectal exam shows no external hemorrhoid, no fissure, no mass and anal tone normal.    No labial fusion. There is lesion on the right labia. There is no rash, tenderness or injury on the right labia. There is lesion on the left labia. There is no rash, tenderness or injury on the left labia. Uterus is deviated.  Uterus is not tender. Cervix exhibits discharge. Cervix exhibits no motion tenderness and no friability. Right adnexum displays no mass, no tenderness and no fullness. Left adnexum displays tenderness. Left adnexum displays no mass and no fullness. No erythema, tenderness or bleeding in the vagina. Vaginal discharge found.  Genitourinary Comments: Numerous non tender lesions located on the labia majora  Musculoskeletal: Normal range of motion. She exhibits no edema, tenderness or deformity.  Lymphadenopathy:    She has no cervical adenopathy.  Neurological: She is alert and oriented to person, place, and time. She has normal strength. No cranial nerve deficit or sensory deficit. She displays a negative Romberg sign. Coordination and gait normal.  Reflex Scores:      Patellar reflexes are 2+ on the right side and 2+ on the left side. Skin: Skin is warm and dry. No rash noted. She is not diaphoretic. No erythema. No pallor.  Psychiatric: She has a normal mood and affect. Her behavior is normal. Judgment and thought content normal.       Patient  has been counseled extensively about nutrition and exercise as well as the importance of adherence with medications and regular follow-up. The patient was given clear instructions to go to ER or return to medical center if symptoms don't improve, worsen or new problems develop. The patient verbalized understanding.   Follow-up: Return if symptoms worsen or fail to improve.   Gildardo Pounds, FNP-BC [facility] and [place], [place]   10/18/2017, 10:08 PM

## 2017-10-19 LAB — CMP14+EGFR
ALT: 15 IU/L (ref 0–32)
AST: 17 IU/L (ref 0–40)
Albumin/Globulin Ratio: 1.3 (ref 1.2–2.2)
Albumin: 4.4 g/dL (ref 3.5–5.5)
Alkaline Phosphatase: 88 IU/L (ref 39–117)
BUN/Creatinine Ratio: 23 (ref 9–23)
BUN: 15 mg/dL (ref 6–24)
Bilirubin Total: 0.2 mg/dL (ref 0.0–1.2)
CO2: 23 mmol/L (ref 20–29)
Calcium: 10.3 mg/dL — ABNORMAL HIGH (ref 8.7–10.2)
Chloride: 100 mmol/L (ref 96–106)
Creatinine, Ser: 0.64 mg/dL (ref 0.57–1.00)
GFR calc Af Amer: 126 mL/min/{1.73_m2} (ref >59)
GFR calc non Af Amer: 110 mL/min/{1.73_m2} (ref >59)
Globulin, Total: 3.3 g/dL (ref 1.5–4.5)
Glucose: 90 mg/dL (ref 65–99)
Potassium: 3.9 mmol/L (ref 3.5–5.2)
Sodium: 140 mmol/L (ref 134–144)
Total Protein: 7.7 g/dL (ref 6.0–8.5)

## 2017-10-19 LAB — HSV TYPE I/II IGG, IGMW/ REFLEX
HSV 1 Glycoprotein G Ab, IgG: 49.4 index — ABNORMAL HIGH (ref 0.00–0.90)
HSV 1 IgM: <1:10 {titer}
HSV 2 IgG, Type Spec: <0.91 index (ref 0.00–0.90)
HSV 2 IgM: <1:10 {titer}

## 2017-10-19 LAB — LIPID PANEL
Chol/HDL Ratio: 4.6 ratio — ABNORMAL HIGH (ref 0.0–4.4)
Cholesterol, Total: 221 mg/dL — ABNORMAL HIGH (ref 100–199)
HDL: 48 mg/dL (ref >39)
LDL Calculated: 147 mg/dL — ABNORMAL HIGH (ref 0–99)
Triglycerides: 132 mg/dL (ref 0–149)
VLDL Cholesterol Cal: 26 mg/dL (ref 5–40)

## 2017-10-19 LAB — CBC
Hematocrit: 43.2 % (ref 34.0–46.6)
Hemoglobin: 14.8 g/dL (ref 11.1–15.9)
MCH: 28.9 pg (ref 26.6–33.0)
MCHC: 34.3 g/dL (ref 31.5–35.7)
MCV: 84 fL (ref 79–97)
Platelets: 249 10*3/uL (ref 150–450)
RBC: 5.12 x10E6/uL (ref 3.77–5.28)
RDW: 13.7 % (ref 12.3–15.4)
WBC: 11.1 10*3/uL — ABNORMAL HIGH (ref 3.4–10.8)

## 2017-10-19 LAB — VITAMIN D 25 HYDROXY (VIT D DEFICIENCY, FRACTURES): Vit D, 25-Hydroxy: 29.2 ng/mL — ABNORMAL LOW (ref 30.0–100.0)

## 2017-10-23 LAB — CYTOLOGY - PAP
Bacterial vaginitis: NEGATIVE
Candida vaginitis: NEGATIVE
Chlamydia: NEGATIVE
Diagnosis: NEGATIVE
HPV: NOT DETECTED
Neisseria Gonorrhea: NEGATIVE
Trichomonas: NEGATIVE

## 2017-10-25 ENCOUNTER — Ambulatory Visit (HOSPITAL_COMMUNITY)
Admission: RE | Admit: 2017-10-25 | Discharge: 2017-10-25 | Disposition: A | Discharge status: Home / Self Care | Payer: Self-pay | Source: Ambulatory Visit | Attending: Nurse Practitioner | Admitting: Nurse Practitioner

## 2017-10-25 DIAGNOSIS — N83201 Unspecified ovarian cyst, right side: Secondary | ICD-10-CM | POA: Insufficient documentation

## 2017-10-25 DIAGNOSIS — D259 Leiomyoma of uterus, unspecified: Secondary | ICD-10-CM | POA: Insufficient documentation

## 2017-10-25 DIAGNOSIS — Z124 Encounter for screening for malignant neoplasm of cervix: Secondary | ICD-10-CM | POA: Insufficient documentation

## 2017-10-26 ENCOUNTER — Other Ambulatory Visit: Payer: Self-pay | Admitting: Nurse Practitioner

## 2017-10-26 ENCOUNTER — Telehealth: Payer: Self-pay

## 2017-10-26 DIAGNOSIS — D219 Benign neoplasm of connective and other soft tissue, unspecified: Secondary | ICD-10-CM

## 2017-10-26 NOTE — Telephone Encounter (Signed)
-----   Message from Gildardo Pounds, NP sent at 10/26/2017 12:50 AM EDT ----- US shows multiple uterine fibroids. Will refer to gynecology for further evaluation and recommendations

## 2017-10-26 NOTE — Telephone Encounter (Signed)
-----   Message from Gildardo Pounds, NP sent at 10/24/2017 11:07 PM EDT ----- Labs are positive for the herpes virus. At this time you do not need any treatment. Your vitamin d levels are also low. I would recommend you take vitamin D over the counter at least 2000mg  daily. Cholesterol levels are elevated. I would recommend you take 2 capsules of fish oil or omega 3 over the counter daily. Your pap smear is normal. Will repeat in 3 years

## 2017-10-26 NOTE — Telephone Encounter (Signed)
CMA spoke to patient.  Patient verified DOB. Patient understood.  Pt. Is aware Gynecology referral has been placed and will call her to schedule and appt.  Pt. Wanted to know what is the next step she can do for the herpes virus she got positive for. Pt. Wanted to know why she was tested positive for it.

## 2017-10-29 NOTE — Telephone Encounter (Signed)
She does not need any treatment unless she is experiencing an outbreak of painful lesions.

## 2017-10-30 ENCOUNTER — Telehealth: Payer: Self-pay | Admitting: Nurse Practitioner

## 2017-10-30 NOTE — Telephone Encounter (Signed)
Patient called because she got a call from a clinic regarding a mammogram. Patient would like more clarification on what appts/things she needs to get done.

## 2017-11-02 NOTE — Telephone Encounter (Signed)
CMA spoke to patient.  Pt. Verified DOB.   [place] [phone] assist with the call.  Pt. Stated she have an upcoming appt. With [facility] on 02/08/2018.

## 2017-11-02 NOTE — Telephone Encounter (Signed)
CMA spoke to patient to inform on PCP advising.  Pt. Verified DOB. Pt. Understood.  Spanish Interpreter Felix Ahmadi [phone] assist with the call.

## 2017-11-06 ENCOUNTER — Other Ambulatory Visit (HOSPITAL_COMMUNITY): Payer: Self-pay | Admitting: *Deleted

## 2017-11-06 DIAGNOSIS — Z1231 Encounter for screening mammogram for malignant neoplasm of breast: Secondary | ICD-10-CM

## 2017-11-08 ENCOUNTER — Encounter: Payer: Self-pay | Admitting: Obstetrics and Gynecology

## 2017-12-06 ENCOUNTER — Encounter: Payer: Self-pay | Admitting: Obstetrics and Gynecology

## 2017-12-06 ENCOUNTER — Ambulatory Visit (INDEPENDENT_AMBULATORY_CARE_PROVIDER_SITE_OTHER): Payer: Self-pay | Admitting: Clinical

## 2017-12-06 ENCOUNTER — Ambulatory Visit (INDEPENDENT_AMBULATORY_CARE_PROVIDER_SITE_OTHER): Payer: Self-pay | Admitting: Obstetrics and Gynecology

## 2017-12-06 VITALS — BP 146/89 | HR 104 | Wt 234.1 lb

## 2017-12-06 DIAGNOSIS — F4322 Adjustment disorder with anxiety: Secondary | ICD-10-CM

## 2017-12-06 DIAGNOSIS — D251 Intramural leiomyoma of uterus: Secondary | ICD-10-CM

## 2017-12-06 NOTE — BH Specialist Note (Signed)
Integrated Behavioral Health Initial Visit  MRN: [id number] Name: Kristin [name]  Number of [place] Clinician visits:: 1/6 Session Start time: 11:54 Session End time: 12:36 Total time: 40 minutes  Type of Service: [place] Interpretor:Yes.   Interpretor Name and Language: Spanish   Warm Hand Off Completed.       SUBJECTIVE: Kristin [name] is a 42 y.o. female accompanied by n/a Patient was referred by Mora Bellman, MD for emotion Patient reports the following symptoms/concerns: Pt states her primary concern today is feeling nervous, , excessive worry, anxiety, sadness, feeling "shaky" ,hair falling out and skin itchiness when she is most nervous; pt copes best by relying on her faith and prayer, and focusing on her blessings (a "very good husband and daughters who are nurses doing good in this world").  Duration of problem: Increasing over time without relief; Severity of problem: moderate  OBJECTIVE: Mood: Anxious and Affect: Appropriate and Tearful Risk of harm to self or others: No plan to harm self or others  LIFE CONTEXT: Family and Social: Pt lives with her husband and 31yo; has two daughters in their 20's(both nurses) School/Work: Cabin crew; pt works fulltime Self-Care: Prayer Life Changes: Move to [place] with limited social contacts and no faith community; recent herpes diagnosis, and several losses in husband's family   GOALS ADDRESSED: Patient will: 1. Reduce symptoms of: anxiety, insomnia and stress 2. Increase knowledge and/or ability of: stress reduction  3. Demonstrate ability to: Increase healthy adjustment to current life circumstances and Increase adequate support systems for patient/family  INTERVENTIONS: Interventions utilized: Supportive Counseling, Psychoeducation and/or Health Education and Link to [employer]  Standardized Assessments completed: GAD-7 and PHQ 9  ASSESSMENT: Patient  currently experiencing Adjustment disorder with anxious mood.   Patient may benefit from psychoeducation and brief therapeutic interventions regarding coping with symptoms of anxiety .  PLAN: 1. Follow up with behavioral health clinician on : In 3 business days by phone. (Follow-up in office visit with [facility]) or [facility])) 2. Behavioral recommendations:  -Discuss BH medication for anxiety with PCP  -Read educational materials regarding coping with symptoms of anxiety -Call [facility] for additional community support (including introduction to local faith community options) -Consider contacting [place] about group support availability and/or [employer] [facility] [place], for Spanish-speaking counseling services, as needed  3. Referral(s): [place] (In Clinic), [place] (LME/Outside Clinic) and Community Resources:  social support 4. "From scale of 1-10, how likely are you to follow plan?": 10  Garlan Fair, LCSW   Depression screen [facility] 2/9 10/18/2017 02/22/2017 12/06/2016 07/09/2014  Decreased Interest 0 0 0 0  Down, Depressed, Hopeless 0 0 0 0  PHQ - 2 Score 0 0 0 0  Altered sleeping 1 0 1 -  Tired, decreased energy 1 0 1 -  Change in appetite 0 0 0 -  Feeling bad or failure about yourself  0 0 0 -  Trouble concentrating 0 0 0 -  Moving slowly or fidgety/restless 0 0 0 -  Suicidal thoughts 0 0 0 -  PHQ-9 Score 2 0 2 -   GAD 7 : Generalized Anxiety Score 10/18/2017 02/22/2017 12/06/2016  Nervous, Anxious, on Edge 0 0 0  Control/stop worrying 0 0 0  Worry too much - different things 1 0 1  Trouble relaxing 0 0 0  Restless 0 0 0  Easily annoyed or irritable 0 0 0  Afraid - awful might happen 0  0 0  Total GAD 7 Score 1 0 1

## 2017-12-06 NOTE — Progress Notes (Signed)
42 yo with BMI 40 and LMP 11/21/2017 referred for the evaluation of fibroid uterus. Patient reports a monthly 4-5-day period. She denies any pelvic pain or abnormal discharge. Patient is sexually active with her husband. She reports a recent diagnosis of herpes and is extremely anxious and stressed about it. Patient is without complaints. She admits to not being worried about the fibroid and more worried about her recent diagnosis.  Past Medical History:  Diagnosis Date  . Ectopic pregnancy   . Nephrolithiasis    No past surgical history on file. No family history on file. Social History   Tobacco Use  . Smoking status: Never Smoker  . Smokeless tobacco: Never Used  Substance Use Topics  . Alcohol use: Yes    Comment: occasionally   . Drug use: No   ROS See pertinent in HPI  Blood pressure (!) 146/89, pulse (!) 104, weight 234 lb 1.6 oz (106.2 kg), last menstrual period 11/21/2017. GENERAL: Well-developed, well-nourished female in no acute distress.  HEENT: Normocephalic, atraumatic. Sclerae anicteric.  NECK: Supple. Normal thyroid.  LUNGS: Clear to auscultation bilaterally.  HEART: Regular rate and rhythm. BREASTS: Symmetric in size. No palpable masses or lymphadenopathy, skin changes, or nipple drainage. ABDOMEN: Soft, nontender, nondistended. No organomegaly. PELVIC: Normal external female genitalia. Vagina is pink and rugated.  Normal discharge. Normal appearing cervix. Uterus is normal in size.  No adnexal mass or tenderness. EXTREMITIES: No cyanosis, clubbing, or edema, 2+ distal pulses.  10/2017 Pelvic ultrasound FINDINGS: Uterus  Measurements: 10.1 x 6.1 x 7.5 cm. Multiple uterine fibroids present. 1.9 x 1.8 x 1.7 cm intramural fibroid at the right uterine fundus. Additional 1.9 x 1.5 x 2.6 cm intramural right fundal fibroid. 1.7 x 1.5 x 1.5 cm intramural fibroid at the left lower uterine segment. Additional 2.3 x 1.6 x 2.1 cm intramural fibroid at the left  posterior mid uterine body.  Endometrium  Thickness: 13.2 mm.  No focal abnormality visualized.  Right ovary  Measurements: 3.7 x 2.3 x 2.4 cm. 2.0 x 1.2 x 2.0 cm simple cyst noted, most consistent with a normal physiologic cyst/dominant follicle.  Left ovary  Measurements: 2.4 x 1.4 x 1.8 cm. Normal appearance/no adnexal mass.  Other findings  No abnormal free fluid.  IMPRESSION: 1. Fibroid uterus as above. 2. 2 cm right ovarian cyst, most consistent with a normal physiologic follicular cyst/dominant follicle. 3. Otherwise unremarkable and normal pelvic ultrasound.   Electronically Signed   By: Jeannine Boga M.D.   On: 10/25/2017 16:48  A/P 42 yo with small fibroid uterus - Ultrasound results reviewed with the patient - Patient with normal pap smear 10/2017 - Screening mammogram scheduled - Reassurance and education provided regarding herpes genitalis - A total of 40 minutes was spent counseling and reassuring the patient with the presence of a spanish interpreter - Patient to see behavioral health specialist due to high anxiety today

## 2017-12-06 NOTE — Progress Notes (Signed)
Spanish interpreter Derenda Mis

## 2017-12-07 ENCOUNTER — Other Ambulatory Visit: Payer: Self-pay | Admitting: Obstetrics and Gynecology

## 2017-12-07 MED ORDER — BUPROPION HCL 75 MG PO TABS: 75.0000 mg | ORAL_TABLET | Freq: Two times a day (BID) | ORAL | 3 refills | Status: DC

## 2017-12-11 ENCOUNTER — Telehealth: Payer: Self-pay | Admitting: Clinical

## 2017-12-11 NOTE — Telephone Encounter (Signed)
Integrated Behavioral Health Medication Management Phone Note  MRN: [id number] NAME: Kristin [name]   Time Call Initiated: 4:44 Time Call Completed: 4:59 Total Call Time: 15 minutes  Current Medications:  Outpatient Medications Prior to Visit  Medication Sig Dispense Refill  . buPROPion (WELLBUTRIN) 75 MG tablet Take 1 tablet (75 mg total) by mouth 2 (two) times daily. 60 tablet 3  . Calcium Carbonate Antacid (TUMS PO) Take by mouth.    Marland Kitchen ibuprofen (ADVIL) 200 MG tablet Take 800 mg by mouth every 6 (six) hours as needed for moderate pain.    Marland Kitchen omeprazole (PRILOSEC) 20 MG capsule Take 20 mg by mouth daily.     No facility-administered medications prior to visit.     Patient has been able to get all medications filled as prescribed: No: Pt will pick up medication at [facility] pharmacy  Patient is currently taking all medications as prescribed: No: Pt could not remember what OTC medication she was told to take one month ago. She is taking Vitamin D, but did not remember the "fish oil or omega 3". Pt says it is a lot to remember, and appreciates the recommendation to obtain a pill box (Found at dollar stores and most pharmacies).   Patient reports experiencing side effects: No  Patient describes feeling this way on medications: -  Additional patient concerns: none at this time  Patient advised to schedule appointment with provider for evaluation of medication side effects or additional concerns: Yes- Pt will follow-up with her PCP at [facility]   [place], LCSW

## 2018-02-08 ENCOUNTER — Ambulatory Visit (HOSPITAL_COMMUNITY)
Admission: RE | Admit: 2018-02-08 | Discharge: 2018-02-08 | Disposition: A | Discharge status: Home / Self Care | Payer: Self-pay | Source: Ambulatory Visit | Attending: Obstetrics and Gynecology | Admitting: Obstetrics and Gynecology

## 2018-02-08 ENCOUNTER — Encounter (HOSPITAL_COMMUNITY): Payer: Self-pay | Admitting: *Deleted

## 2018-02-08 ENCOUNTER — Encounter (HOSPITAL_COMMUNITY): Payer: Self-pay

## 2018-02-08 ENCOUNTER — Ambulatory Visit
Admission: RE | Admit: 2018-02-08 | Discharge: 2018-02-08 | Disposition: A | Discharge status: Home / Self Care | Payer: No Typology Code available for payment source | Source: Ambulatory Visit | Attending: Obstetrics and Gynecology | Admitting: Obstetrics and Gynecology

## 2018-02-08 VITALS — BP 128/82 | Wt 228.0 lb

## 2018-02-08 DIAGNOSIS — Z1239 Encounter for other screening for malignant neoplasm of breast: Secondary | ICD-10-CM

## 2018-02-08 DIAGNOSIS — Z1231 Encounter for screening mammogram for malignant neoplasm of breast: Secondary | ICD-10-CM

## 2018-02-08 NOTE — Patient Instructions (Signed)
Explained breast self awareness with Bary Castilla. Patient did not need a Pap smear today due to her last Pap smear and HPV typing was 10/18/2017. Let patient know she needs to talk with her provider about when she will need her next Pap smear due to her history of Herpes. Referred patient to the [facility] for a screening mammogram. Appointment scheduled for Thursday, February 08, 2018 at [facility] [place]. Patient aware of appointment and will be there. Let patient know the [facility]  will follow up with her within the next couple weeks with results of mammogram by letter or phone. Kacey Dysert verbalized understanding.  Helyne Genther, Arvil Ch[place], RN 8:50 AM

## 2018-02-08 NOTE — Progress Notes (Signed)
No complaints today.   Pap Smear: Pap smear not completed today. Last Pap smear was 10/18/2017 at [facility] and normal with negative HPV. Per patient has no history of an abnormal Pap smear. Last Pap smear result is in Epic.  Physical exam: Breasts Breasts symmetrical. No skin abnormalities bilateral breasts. No nipple retraction bilateral breasts. No nipple discharge bilateral breasts. No lymphadenopathy. No lumps palpated bilateral breasts. No complaints of pain or tenderness on exam. Referred patient to the [place] for a screening mammogram. Appointment scheduled for Thursday, February 08, 2018 at [place].        Pelvic/Bimanual No Pap smear completed today since last Pap smear and HPV typing was 10/18/2017. Pap smear not indicated per BCCCP guidelines.   Smoking History: Patient has never smoked.  Patient Navigation: Patient education provided. Access to services provided for patient through [facility] program. Spanish interpreter provided. Taxi voucher provided to appointment at the [facility].  Breast and Cervical Cancer Risk Assessment: Patient has no family history of breast cancer, known genetic mutations, or radiation treatment to the chest before age 65. Patient has no history of cervical dysplasia, immunocompromised, or DES exposure in-utero.  Risk Assessment    Risk Scores      02/08/2018   Last edited by: Armond Hang, LPN   5-year risk: 0.4 %   Lifetime risk: 5.5 %         Used Spanish interpreter Rudene Anda from [place].

## 2018-03-14 ENCOUNTER — Ambulatory Visit: Payer: Self-pay | Attending: Nurse Practitioner | Admitting: Nurse Practitioner

## 2018-03-14 ENCOUNTER — Encounter: Payer: Self-pay | Admitting: Nurse Practitioner

## 2018-03-14 VITALS — BP 137/103 | HR 95 | Temp 99.5°F | Ht 64.0 in | Wt 228.8 lb

## 2018-03-14 DIAGNOSIS — L659 Nonscarring hair loss, unspecified: Secondary | ICD-10-CM

## 2018-03-14 DIAGNOSIS — F325 Major depressive disorder, single episode, in full remission: Secondary | ICD-10-CM

## 2018-03-14 NOTE — Progress Notes (Signed)
Assessment & Plan:  Kristin [name] was seen today for follow-up.  Diagnoses and all orders for this visit:  Major depressive disorder with single episode, in full remission ([place]) Improved.  We will follow-up in 2 to 3 weeks.  Hair thinning Continue biotin as instructed TSH and CBC within normal limits  Patient has been counseled on age-appropriate routine health concerns for screening and prevention. These are reviewed and up-to-date. Referrals have been placed accordingly. Immunizations are up-to-date or declined.    Subjective:   Chief Complaint  Patient presents with  . Follow-up    Pt. is here to follow-up on depression. Pt. would like to ask PCP regarding about herpes and rash on face with itchiness sometimes.    HPI Kristin [name] 43 y.o. female presents to office today for follow-up to depression. She is concerned about hair thinning. Very stressed. Taking Biotin 1000 mg I have encouraged her to take at least 10,000 units daily. Her last TSH was normal. She started wellbutrin several months ago and states a fine rash appeared on her forehead and cheeks a few weeks after starting. Will taper Wellbutrin off at this time and see if rash resolves.   Depression She bupropion 75 mg twice daily in November 2019.  States she became significantly depressed after a phone call that was made from my office stating that she had herpes several months ago.  Unfortunately the phone call was misinterpreted and I explained to her in detail today how she has antibodies for herpes type I however she does not have any active infection of herpes and that the virus can lie dormant for years or a lifetime. She is very tearful and states she has been stressed thinking how the she could keep her children from getting the virus from her and if she was going to die from it. I spent at least 15 minutes reassuring her that the herpes virus is not currently active and if she has no lesions at this time there is no  treatment required.  Depression screen [facility] 2/9 03/14/2018 10/18/2017 02/22/2017 12/06/2016 07/09/2014  Decreased Interest 0 0 0 0 0  Down, Depressed, Hopeless 1 0 0 0 0  PHQ - 2 Score 1 0 0 0 0  Altered sleeping 0 1 0 1 -  Tired, decreased energy 0 1 0 1 -  Change in appetite 0 0 0 0 -  Feeling bad or failure about yourself  0 0 0 0 -  Trouble concentrating 0 0 0 0 -  Moving slowly or fidgety/restless 0 0 0 0 -  Suicidal thoughts 0 0 0 0 -  PHQ-9 Score 1 2 0 2 -      Review of Systems  Constitutional: Negative for fever, malaise/fatigue and weight loss.       Hair thinning  HENT: Negative.  Negative for nosebleeds.   Eyes: Negative.  Negative for blurred vision, double vision and photophobia.  Respiratory: Negative.  Negative for cough and shortness of breath.   Cardiovascular: Negative.  Negative for chest pain, palpitations and leg swelling.  Gastrointestinal: Negative.  Negative for heartburn, nausea and vomiting.  Musculoskeletal: Negative.  Negative for myalgias.  Skin: Positive for rash. Negative for itching.  Neurological: Negative.  Negative for dizziness, focal weakness, seizures and headaches.  Psychiatric/Behavioral: Positive for depression. Negative for suicidal ideas.    Past Medical History:  Diagnosis Date  . Ectopic pregnancy   . Nephrolithiasis     Past Surgical History:  Procedure Laterality Date  .  etopic pregnancy      Family History  Problem Relation Age of Onset  . Breast cancer Neg Hx     Social History Reviewed with no changes to be made today.   Outpatient Medications Prior to Visit  Medication Sig Dispense Refill  . buPROPion (WELLBUTRIN) 75 MG tablet Take 1 tablet (75 mg total) by mouth 2 (two) times daily. 60 tablet 3  . Calcium Carbonate Antacid (TUMS PO) Take by mouth.    Marland Kitchen ibuprofen (ADVIL) 200 MG tablet Take 800 mg by mouth every 6 (six) hours as needed for moderate pain.    Marland Kitchen omeprazole (PRILOSEC) 20 MG capsule Take 20 mg by mouth  daily.     No facility-administered medications prior to visit.     No Known Allergies     Objective:    BP (!) 137/103 (BP Location: Left Arm, Patient Position: Sitting, Cuff Size: Large)   Pulse 95   Temp 99.5 F (37.5 C) (Oral)   Ht 5\' 4"  (1.626 m)   Wt 228 lb 12.8 oz (103.8 kg)   SpO2 99%   BMI 39.27 kg/m  Wt Readings from Last 3 Encounters:  03/14/18 228 lb 12.8 oz (103.8 kg)  02/08/18 228 lb (103.4 kg)  12/06/17 234 lb 1.6 oz (106.2 kg)    Physical Exam Vitals signs and nursing note reviewed.  Constitutional:      Appearance: She is well-developed.  HENT:     Head: Normocephalic and atraumatic.      Comments: Mildly erythematous rash located on the central forehead and bilateral cheeks.  No rash.  The rash does not have a butterfly appearance Neck:     Musculoskeletal: Normal range of motion.  Cardiovascular:     Rate and Rhythm: Normal rate and regular rhythm.     Heart sounds: Normal heart sounds. No murmur. No friction rub. No gallop.   Pulmonary:     Effort: Pulmonary effort is normal. No tachypnea or respiratory distress.     Breath sounds: Normal breath sounds. No decreased breath sounds, wheezing, rhonchi or rales.  Chest:     Chest wall: No tenderness.  Abdominal:     General: Bowel sounds are normal.     Palpations: Abdomen is soft.  Musculoskeletal: Normal range of motion.  Skin:    General: Skin is warm and dry.     Findings: Erythema and rash present. Rash is macular.  Neurological:     Mental Status: She is alert and oriented to person, place, and time.     Coordination: Coordination normal.  Psychiatric:        Behavior: Behavior normal. Behavior is cooperative.        Thought Content: Thought content normal.        Judgment: Judgment normal.          Patient has been counseled extensively about nutrition and exercise as well as the importance of adherence with medications and regular follow-up. The patient was given clear  instructions to go to ER or return to medical center if symptoms don't improve, worsen or new problems develop. The patient verbalized understanding.   Follow-up: Return in about 3 years (around 03/13/2021) for depression.   Gildardo Pounds, FNP-BC [facility] [place], [place]   03/14/2018, 5:27 PM

## 2018-03-14 NOTE — Patient Instructions (Signed)
Begin taking 1 tablet of Buproprion ONCE A DAY for 7 days After 7 days Take 1/2 Tablet of Buproprion ONCE a DAY for 7 days then stop.

## 2018-03-14 NOTE — Addendum Note (Signed)
Addended by: [name] on: 03/14/2018 05:31 PM   Modules accepted: Orders

## 2018-03-30 ENCOUNTER — Telehealth: Payer: Self-pay | Admitting: Nurse Practitioner

## 2018-03-30 NOTE — Telephone Encounter (Signed)
Patients call taken.  Patient identified by name and date of birth. Patient was concerned about her appointment being cancelled as she was wondering about her blood pressure medication.  Patient state is feeling better.

## 2018-03-30 NOTE — Telephone Encounter (Signed)
Called patient to cancel her appt due to COVID-19 and patient rescheduled appt but is worried because she was prescribed depression medication and does not know if her PCP wants to leave her with the same medication. Please f/u

## 2018-04-02 ENCOUNTER — Ambulatory Visit: Payer: Self-pay | Admitting: Nurse Practitioner

## 2018-04-02 NOTE — Telephone Encounter (Signed)
Needs to see [facility] for BP recheck.

## 2018-04-03 NOTE — Telephone Encounter (Signed)
Spoke w/ Dr. Margarita Rana. I think, with the exception of INR visits, we are going to limit my visits to telephone/virtual visits. I am not sure how to schedule this patient for a telephone visit. Gaetano Net can you help here?

## 2018-04-03 NOTE — Telephone Encounter (Signed)
Patients call returned.  Patient identified by name and date of birth. Patient advised to go to [facility] and take her BP two (2) different times and record.  If high call the clinic and a visit would be set up.  Patient advised if she begins to have a headache, blurred vision, lightheadedness to go to [facility] or ED and follow up in clinic.

## 2018-04-04 ENCOUNTER — Ambulatory Visit: Payer: Self-pay

## 2018-05-09 ENCOUNTER — Encounter: Payer: Self-pay | Admitting: Nurse Practitioner

## 2018-05-09 ENCOUNTER — Ambulatory Visit: Payer: Self-pay | Attending: Nurse Practitioner | Admitting: Nurse Practitioner

## 2018-05-09 ENCOUNTER — Other Ambulatory Visit: Payer: Self-pay

## 2018-05-09 DIAGNOSIS — F329 Major depressive disorder, single episode, unspecified: Secondary | ICD-10-CM

## 2018-05-09 DIAGNOSIS — F419 Anxiety disorder, unspecified: Secondary | ICD-10-CM

## 2018-05-09 DIAGNOSIS — F32A Depression, unspecified: Secondary | ICD-10-CM

## 2018-05-09 MED ORDER — ESCITALOPRAM OXALATE 10 MG PO TABS: 10.0000 mg | ORAL_TABLET | Freq: Every day | ORAL | 1 refills | Status: DC

## 2018-05-09 NOTE — Progress Notes (Signed)
Virtual Visit via Telephone Note Due to national recommendations of social distancing due to [place] 19, telehealth visit is felt to be most appropriate for this patient at this time.  I discussed the limitations, risks, security and privacy concerns of performing an evaluation and management service by telephone and the availability of in person appointments. I also discussed with the patient that there may be a patient responsible charge related to this service. The patient expressed understanding and agreed to proceed.    I connected with Kristin [name] on 05/09/18  at   3:30 PM EDT  EDT by telephone and verified that I am speaking with the correct person using two identifiers.   Consent I discussed the limitations, risks, security and privacy concerns of performing an evaluation and management service by telephone and the availability of in person appointments. I also discussed with the patient that there may be a patient responsible charge related to this service. The patient expressed understanding and agreed to proceed.   Location of Patient: Private Residence   Location of Provider: [employer] and [place] participating in Telemedicine visit: [name] FNP-BC [employer] AG#[id number]   History of Present Illness: Telemedicine visit for: Follow up to Depression and Anxiety Weaned off Wellbutrin last month due to facial rash. Currently still with mood lability. States she will cry sometimes for no reason and other times she is irritable or feeling okay. Will start lexapro 10mg  today. She denies any current thoughts of self harm. Depression screen [facility] 2/9 05/09/2018 03/14/2018 10/18/2017 02/22/2017 12/06/2016  Decreased Interest 0 0 0 0 0  Down, Depressed, Hopeless 2 1 0 0 0  PHQ - 2 Score 2 1 0 0 0  Altered sleeping 1 0 1 0 1  Tired, decreased energy 0 0 1 0 1  Change in appetite 0 0 0 0 0  Feeling bad or failure  about yourself  0 0 0 0 0  Trouble concentrating 0 0 0 0 0  Moving slowly or fidgety/restless 0 0 0 0 0  Suicidal thoughts 0 0 0 0 0  PHQ-9 Score 3 1 2  0 2   GAD 7 : Generalized Anxiety Score 05/09/2018 03/14/2018 10/18/2017 02/22/2017  Nervous, Anxious, on Edge 1 1 0 0  Control/stop worrying 1 0 0 0  Worry too much - different things 1 1 1  0  Trouble relaxing 1 0 0 0  Restless 0 0 0 0  Easily annoyed or irritable 0 0 0 0  Afraid - awful might happen 0 0 0 0  Total GAD 7 Score 4 2 1  0     Past Medical History:  Diagnosis Date  . Ectopic pregnancy   . Nephrolithiasis     Past Surgical History:  Procedure Laterality Date  . etopic pregnancy      Family History  Problem Relation Age of Onset  . Breast cancer Neg Hx     Social History   Socioeconomic History  . Marital status: Single    Spouse name: Not on file  . Number of children: 3  . Years of education: Not on file  . Highest education level: 6th grade  Occupational History  . Not on file  Social Needs  . Financial resource strain: Not on file  . Food insecurity:    Worry: Not on file    Inability: Not on file  . Transportation needs:    Medical: No  Non-medical: No  Tobacco Use  . Smoking status: Never Smoker  . Smokeless tobacco: Never Used  Substance and Sexual Activity  . Alcohol use: Yes    Comment: occasionally   . Drug use: No  . Sexual activity: Yes  Lifestyle  . Physical activity:    Days per week: Not on file    Minutes per session: Not on file  . Stress: Not on file  Relationships  . Social connections:    Talks on phone: Not on file    Gets together: Not on file    Attends religious service: Not on file    Active member of club or organization: Not on file    Attends meetings of clubs or organizations: Not on file    Relationship status: Not on file  Other Topics Concern  . Not on file  Social History Narrative  . Not on file     Observations/Objective: Awake, alert and oriented x  3   Review of Systems  Constitutional: Negative for fever, malaise/fatigue and weight loss.  HENT: Negative.  Negative for nosebleeds.   Eyes: Negative.  Negative for blurred vision, double vision and photophobia.  Respiratory: Negative.  Negative for cough and shortness of breath.   Cardiovascular: Negative.  Negative for chest pain, palpitations and leg swelling.  Gastrointestinal: Negative.  Negative for heartburn, nausea and vomiting.  Musculoskeletal: Negative.  Negative for myalgias.  Neurological: Negative.  Negative for dizziness, focal weakness, seizures and headaches.  Psychiatric/Behavioral: Positive for depression. Negative for suicidal ideas. The patient is nervous/anxious.     Assessment and Plan: Diagnoses and all orders for this visit:  Anxiety and depression -     escitalopram (LEXAPRO) 10 MG tablet; Take 1 tablet (10 mg total) by mouth daily for 30 days.     Follow Up Instructions Return in about 3 weeks (around 05/30/2018).     I discussed the assessment and treatment plan with the patient. The patient was provided an opportunity to ask questions and all were answered. The patient agreed with the plan and demonstrated an understanding of the instructions.   The patient was advised to call back or seek an in-person evaluation if the symptoms worsen or if the condition fails to improve as anticipated.  I provided 28 minutes of non-face-to-face time during this encounter including median intraservice time, reviewing previous notes, labs, imaging, medications and explaining diagnosis and management.  Gildardo Pounds, FNP-BC

## 2018-05-30 ENCOUNTER — Ambulatory Visit: Payer: No Typology Code available for payment source | Admitting: Nurse Practitioner

## 2018-09-10 ENCOUNTER — Ambulatory Visit: Payer: Self-pay | Attending: Nurse Practitioner | Admitting: Nurse Practitioner

## 2018-09-10 ENCOUNTER — Encounter: Payer: Self-pay | Admitting: Nurse Practitioner

## 2018-09-10 ENCOUNTER — Other Ambulatory Visit: Payer: Self-pay

## 2018-09-10 VITALS — BP 127/84 | HR 103 | Temp 99.0°F | Ht 64.0 in | Wt 243.0 lb

## 2018-09-10 DIAGNOSIS — G8929 Other chronic pain: Secondary | ICD-10-CM

## 2018-09-10 DIAGNOSIS — M545 Low back pain, unspecified: Secondary | ICD-10-CM

## 2018-09-10 DIAGNOSIS — Z130 Encounter for screening for diseases of the blood and blood-forming organs and certain disorders involving the immune mechanism: Secondary | ICD-10-CM

## 2018-09-10 DIAGNOSIS — D72829 Elevated white blood cell count, unspecified: Secondary | ICD-10-CM

## 2018-09-10 DIAGNOSIS — Z6841 Body Mass Index (BMI) 40.0 and over, adult: Secondary | ICD-10-CM

## 2018-09-10 DIAGNOSIS — E782 Mixed hyperlipidemia: Secondary | ICD-10-CM

## 2018-09-10 MED ORDER — PHENTERMINE HCL 37.5 MG PO TABS: 37.5000 mg | ORAL_TABLET | Freq: Every day | ORAL | 1 refills | Status: DC

## 2018-09-10 NOTE — Progress Notes (Signed)
Assessment & Plan:  Kristin [name] was seen today for weight loss.  Diagnoses and all orders for this visit:  Morbid obesity with BMI of 40.0-44.9, adult ([place]) -     Basic metabolic panel -     phentermine (ADIPEX-P) 37.5 MG tablet; Take 1 tablet (37.5 mg total) by mouth daily before breakfast. Discussed diet and exercise for person with BMI >40. Instructed: You must burn more calories than you eat. Losing 5 percent of your body weight should be considered a success. In the longer term, losing more than 15 percent of your body weight and staying at this weight is an extremely good result. However, keep in mind that even losing 5 percent of your body weight leads to important health benefits, so try not to get discouraged if you're not able to lose more than this. Will recheck weight in 3-6 months.  Mixed hyperlipidemia -     Lipid panel INSTRUCTIONS: Work on a low fat, heart healthy diet and participate in regular aerobic exercise program by working out at least 150 minutes per week; 5 days a week-30 minutes per day. Avoid red meat, fried foods. junk foods, sodas, sugary drinks, unhealthy snacking, alcohol and smoking.  Drink at least 48oz of water per day and monitor your carbohydrate intake daily.    Screening for deficiency anemia -     CBC  Leukocytosis, unspecified type -     CBC  Chronic bilateral low back pain without sciatica -     DG Lumbar Spine Complete; Future Work on losing weight to help reduce back pain. May alternate with heat and ice application for pain relief. May also alternate with acetaminophen and Ibuprofen as prescribed for back pain. Other alternatives include massage, acupuncture and water aerobics.  You must stay active and avoid a sedentary lifestyle.      Patient has been counseled on age-appropriate routine health concerns for screening and prevention. These are reviewed and up-to-date. Referrals have been placed accordingly. Immunizations are up-to-date or  declined.    Subjective:   Chief Complaint  Patient presents with  . Weight Loss    Pt. is here to discuss about weight loss.    HPI Kristin [name] 43 y.o. female presents to office today for weight loss. She is concerned about her weight loss. VRI was used to communicate directly with patient for the entire encounter including providing detailed patient instructions.   Obesity She has gained 15 lbs in 5 months. She has tried to cut back on the amount of tortillas that she eats. She is not exercising. States it hurts her back to exercise although her chiropractor has instructed her to lose weight and start exercising to help with her lower back pain. I have instructed her that in order to lose weight after 40 she has to [place]. She can not just change the way that she is eating. She verbalized understanding.  Will try phentermine for 30 days.    Low Back Pain She has Chronic low back pain since 2017. Denies any injury or trauma. Pain radiates to her left side, hip and left knee. She has never had physical therapy. Xray in 2017 lumbar spine: Negative. Aggravating factors: prolonged sitting, standing, walking.   Mixed Hyperlipidemia Poorly controlled. Discussed diet and exercise compliance. Will recheck lipids today. May need to start statin.  Lab Results  Component Value Date   LDLCALC 147 (H) 10/18/2017    Review of Systems  Constitutional: Negative for fever, malaise/fatigue and weight  loss.  HENT: Negative.  Negative for nosebleeds.   Eyes: Negative.  Negative for blurred vision, double vision and photophobia.  Respiratory: Negative.  Negative for cough and shortness of breath.   Cardiovascular: Negative.  Negative for chest pain, palpitations and leg swelling.  Gastrointestinal: Negative.  Negative for heartburn, nausea and vomiting.  Musculoskeletal: Positive for back pain. Negative for myalgias.  Neurological: Negative.  Negative for dizziness, focal weakness, seizures and  headaches.  Psychiatric/Behavioral: Negative.  Negative for suicidal ideas.    Past Medical History:  Diagnosis Date  . Ectopic pregnancy   . Nephrolithiasis     Past Surgical History:  Procedure Laterality Date  . etopic pregnancy      Family History  Problem Relation Age of Onset  . Breast cancer Neg Hx     Social History Reviewed with no changes to be made today.   Outpatient Medications Prior to Visit  Medication Sig Dispense Refill  . omeprazole (PRILOSEC) 20 MG capsule Take 20 mg by mouth daily.    . Calcium Carbonate Antacid (TUMS PO) Take by mouth.    . escitalopram (LEXAPRO) 10 MG tablet Take 1 tablet (10 mg total) by mouth daily for 30 days. 30 tablet 1   No facility-administered medications prior to visit.     Allergies  Allergen Reactions  . Wellbutrin [Bupropion] Rash       Objective:    BP 127/84 (BP Location: Left Arm, Patient Position: Sitting, Cuff Size: Large)   Pulse (!) 103   Temp 99 F (37.2 C) (Oral)   Ht 5\' 4"  (1.626 m)   Wt 243 lb (110.2 kg)   SpO2 100%   BMI 41.71 kg/m  Wt Readings from Last 3 Encounters:  09/10/18 243 lb (110.2 kg)  03/14/18 228 lb 12.8 oz (103.8 kg)  02/08/18 228 lb (103.4 kg)    Physical Exam Vitals signs and nursing note reviewed.  Constitutional:      Appearance: She is well-developed.  HENT:     Head: Normocephalic and atraumatic.  Neck:     Musculoskeletal: Normal range of motion.  Cardiovascular:     Rate and Rhythm: Normal rate and regular rhythm.     Heart sounds: Normal heart sounds. No murmur. No friction rub. No gallop.   Pulmonary:     Effort: Pulmonary effort is normal. No tachypnea or respiratory distress.     Breath sounds: Normal breath sounds. No decreased breath sounds, wheezing, rhonchi or rales.  Chest:     Chest wall: No tenderness.  Abdominal:     General: Bowel sounds are normal.     Palpations: Abdomen is soft.  Musculoskeletal: Normal range of motion.     Lumbar back: She  exhibits no tenderness, no bony tenderness, no pain and no spasm.  Skin:    General: Skin is warm and dry.  Neurological:     Mental Status: She is alert and oriented to person, place, and time.     Coordination: Coordination normal.  Psychiatric:        Behavior: Behavior normal. Behavior is cooperative.        Thought Content: Thought content normal.        Judgment: Judgment normal.        Patient has been counseled extensively about nutrition and exercise as well as the importance of adherence with medications and regular follow-up. The patient was given clear instructions to go to ER or return to medical center if symptoms don't improve, worsen or  new problems develop. The patient verbalized understanding.   Follow-up: Return in about 4 weeks (around 10/08/2018) for weight check.   Gildardo Pounds, FNP-BC [facility] and [facility] [place], [place]   09/10/2018, 9:43 PM

## 2018-09-10 NOTE — Patient Instructions (Addendum)
Phentermine orally disintegrating tablets (ODT) Qu es este medicamento? La FENTERMINA reduce su apetito. Se combina con una dieta de bajas caloras y ejerci para ayudarle a bajar de [place]. Este medicamento puede ser utilizado para otros usos; si tiene alguna pregunta consulte con su proveedor de atencin mdica o con su farmacutico. MARCAS COMUNES: Suprenza Qu le debo informar a mi profesional de la salud antes de tomar este medicamento? Necesitan saber si usted presenta alguno de los siguientes problemas o situaciones: agitacin o nerviosismo diabetes glaucoma enfermedad cardiaca presin sangunea alta antecedentes de abuso de drogas o drogadiccin antecedentes de accidente cerebrovascular enfermedad renal enfermedad pulmonar llamada hipertensin pulmonar primaria si ha tomado un IMAO, como Carbex, Eldepryl, Marplan, [place] o Parnate en los ltimos [address] tomar medicamentos estimulantes para trastornos de atencin, Sports coach de peso o mantenerse despierto enfermedad tiroidea una reaccin alrgica o inusual a la fentermina, a otros medicamentos, alimentos, colorantes o conservantes si est embarazada o buscando quedar embarazada si est amamantando a un beb Cmo debo Blue[employer]x? Tome este medicamento por va oral. Siga las instrucciones de la etiqueta del [place]. Las Water engineer en el frasco hasta inmediatamente antes de tomar la dosis. Retire la dosis del frasco con manos secas. No corte ni divida las tabletas por la mitad. Coloque la tableta dentro de la boca, deje que se disuelva y luego trguela. Puede tomar estas tabletas con agua, pero no es necesario. Tome sus dosis a intervalos regulares. No tome su medicamento con una frecuencia mayor a la indicada. No deje de tomarlo, excepto si as lo indica su mdico o su profesional de [employer]	[place]. Hable con su pediatra para informarse acerca del uso de este medicamento en nios. Puede requerir atencin especial. Sobredosis:  Pngase en contacto inmediatamente con un centro toxicolgico o una sala de urgencia si usted cree que haya tomado demasiado medicamento. ATENCIN: [employer] es solo para usted. No comparta este medicamento con nadie. Qu sucede si me olvido de una dosis? Si se olvida una dosis, tmela lo antes posible. Si es casi la hora de la prxima dosis, tome slo esa dosis. No tome dosis adicionales o dobles. Qu puede interactuar con este medicamento? No tome este medicamento con ninguno de los siguientes frmacos: IMAO, tales como Carbex, Eldepryl, Marplan, [place]il y Parnate medicamentos para resfros o dificultades para respirar tales como pseudoefedrina o fenilefrina procarbazina sibutramina medicamentos estimulantes para trastornos de atencin, Sports coach de peso o mantenerse despierto Esta medicina tambin puede interactuar con los siguientes medicamentos: ciertos medicamentos para la depresin, ansiedad o trastornos psicticos linezolida medicamentos para la diabetes medicamentos para la alta presin sangunea Puede ser que esta lista no menciona todas las posibles interacciones. Informe a su profesional de [employer]	[place] de [employer] productos a base de hierbas, medicamentos de [place] o suplementos nutritivos que est tomando. Si usted fuma, consume bebidas alcohlicas o si utiliza drogas ilegales, indqueselo tambin a su profesional de [employer]	[place]. Algunas sustancias pueden interactuar con su medicamento. A qu debo estar atento al usar Coca-Cola? Informe a su mdico de inmediato si siente que le falta el aliento mientras realiza sus actividades habituales. No tome este medicamento dentro de 6 horas de la hora de [place]. Puede impedirle dormir. Evite las bebidas que contienen cafena y trata de Theatre manager un horario de acostarse habitual cada noche. Este medicamento fue diseado para ser utilizado en combinacin con una dieta saludable y ejercicio. De esta manera se obtienen los [employer]. Este medicamento slo est  indicado para uso a Control and instrumentation engineer. Con el tiempo, su peso puede estabilizarse. A partir de [employer], el medicamento slo le ayudar a Theatre manager su [employer]. No aumente ni modifique su dosis de ninguna manera sin consultar a su mdico. Puede experimentar mareos o somnolencia. No conduzca ni utilice maquinaria ni haga nada que Associate Professor en estado de alerta hasta que sepa cmo le afecta este medicamento. No se siente ni se ponga de pie con rapidez, especialmente si es un paciente de edad avanzada. Esto reduce el riesgo de mareos o [employer]. El alcohol puede aumentar los mareos y la somnolencia. Evite consumir bebidas alcohlicas. Qu efectos secundarios puedo tener al [employer] este medicamento? Efectos secundarios que debe informar a su mdico o a Barrister's clerk de la salud tan pronto como sea posible: Chief of Staff, como erupcin cutnea, comezn/picazn o urticaria, e hinchazn de la cara, los labios o la lengua ansiedad problemas respiratorios cambios en la visin dolor u opresin en el pecho estado de nimo deprimido u otros cambios en el humor alucinaciones, prdida del contacto con la realidad ritmo cardiaco rpido, irregular aumento de la presin sangunea irritabilidad nerviosismo o inquietud dolor al orinar palpitaciones temblores dificultad para conciliar el sueo convulsiones signos y sntomas de un derrame cerebral, tales como cambios en la visin; confusin; dificultad para hablar o entender; dolores de cabeza severos; entumecimiento o debilidad repentina de la cara, el brazo o la pierna; problemas al caminar; Chief of Staff; prdida del equilibrio o la coordinacin cansancio o debilidad inusual vmito Efectos secundarios que generalmente no requieren Geophysical data processor (infrmelos a su mdico o a Barrister's clerk de la salud si persisten o si son molestos): estreimiento o diarrea boca seca dolor de cabeza nuseas Higher education careers adviser sudoracin Puede ser  que esta lista no menciona todos los posibles efectos secundarios. Comunquese a su mdico por asesoramiento mdico Hum[employer] Inc. Usted puede informar los efectos secundarios a la FDA por telfono al 1-800-FDA-1088. Dnde debo guardar mi medicina? Mantenga fuera del alcance de los nios. Existe la posibilidad de abusar de Coca[employer]Cola. Mantenga su medicamento en un lugar seguro para protegerlo contra robos. No comparta este medicamento con nadie. Es [place], y est prohibido por la ley. Este medicamento puede causar Ardelia Mems sobredosis accidental y la muerte si lo toman otros adultos, nios o Copy. Mezcle todo el medicamento sin usar con una sustancia como piedras sanitarias para gatos o posos (residuos) de caf. Luego deseche el [employer] un recipiente cerrado, como una bolsa sellada o una lata de caf con tapa. No utilice este medicamento despus de la fecha de vencimiento. Guarde a [employer], entre 20 y 6 grados Celsius (22 y 10 grados Fahrenheit). [place]. ATENCIN: Este folleto es un resumen. Puede ser que no cubra toda la posible informacin. Si usted tiene preguntas acerca de esta medicina, consulte con su mdico, su farmacutico o su profesional de Technical sales engineer.  2020 Elsevier/Gold Standard (2016-08-04 00:00:00)  Hipertensin en los adultos Hypertension, Adult El trmino hipertensin es otra forma de denominar a la presin arterial elevada. La presin arterial elevada fuerza al corazn a trabajar ms para bombear la sangre. Esto puede causar problemas con el paso del [place]. Una lectura de presin arterial est compuesta por 2 nmeros. Hay un nmero superior (sistlico) sobre un nmero inferior (diastlico). Lo ideal es tener la presin arterial por debajo de 120/80. Las elecciones saludables pueden ayudar a Engineer, materials presin arterial, o tal vez necesite  medicamentos para bajarla. Cules son las  causas? Se desconoce la causa de esta afeccin. Algunas afecciones pueden estar relacionadas con la presin arterial alta. Qu incrementa el riesgo?  Fumar.  Tener diabetes mellitus tipo 2, colesterol alto, o ambos.  No hacer la cantidad suficiente de actividad fsica o ejercicio.  Tener sobrepeso.  Consumir mucha grasa, azcar, caloras o sal (sodio) en su dieta.  Beber alcohol en exceso.  Tener una enfermedad renal a largo plazo (crnica).  Tener antecedentes familiares de presin arterial alta.  Edad. Los riesgos aumentan con la edad.  [place]. El riesgo es mayor para las Retail banker.  Sexo. Antes de los 45aos, los hombres corren ms [employer]. Despus de los 65aos, las mujeres corren ms [employer].  Tener apnea obstructiva del sueo.  Estrs. Cules son los signos o los sntomas?  Es posible que la presin arterial alta puede no cause sntomas. La presin arterial muy alta (crisis hipertensiva) puede provocar: ? Dolor de [place]. ? Sensaciones de preocupacin o nerviosismo (ansiedad). ? Falta de aire. ? Hemorragia nasal. ? Sensacin de Engineer, site (nuseas). ? Vmitos. ? Cambios en la forma de ver. ? Dolor muy intenso en el pecho. ? Convulsiones. Cmo se trata?  Esta afeccin se trata haciendo cambios saludables en el estilo de vida, por ejemplo: ? Consumir alimentos saludables. ? Hacer ms ejercicio. ? Beber menos alcohol.  El mdico puede recetarle medicamentos si los cambios en el estilo de vida no son suficientes para Child psychotherapist la presin arterial y si: ? El nmero de arriba est por encima de 130. ? El nmero de abajo est por encima de 80.  Su presin arterial personal ideal puede variar. Siga estas instrucciones en su casa: Comida y bebida   Si se lo dicen, siga el plan de alimentacin de DASH (Dietary Approaches to Stop Hypertension, Maneras de alimentarse para detener la hipertensin).  Para seguir este plan: ? Llene la mitad del plato de cada comida con frutas y verduras. ? Llene un cuarto del plato de cada comida con cereales integrales. Los cereales integrales incluyen pasta integral, arroz integral y pan integral. ? Coma y beba productos lcteos con bajo contenido de grasa, como leche descremada o yogur bajo en grasas. ? Llene un cuarto del plato de cada comida con protenas bajas en grasa (magras). Las protenas bajas en grasa incluyen pescado, pollo sin piel, huevos, frijoles y tofu. ? Evite consumir carne grasa, carne curada y procesada, o pollo con piel. ? Evite consumir alimentos prehechos o procesados.  Consuma menos de 1500 mg de sal por da.  No beba alcohol si: ? El mdico le indica que no lo haga. ? Est embarazada, puede estar embarazada o est tratando de quedar embarazada.  Si bebe alcohol: ? Limite la cantidad que bebe a lo siguiente:  De 0 a 1 medida por da para las mujeres.  De 0 a 2 medidas por da para los hombres. ? Est atento a la cantidad de alcohol que hay en las bebidas que toma. En los [place], una medida equivale a una botella de cerveza de 12oz (32ml), un vaso de vino de 5oz (11ml) o un vaso de una bebida alcohlica de alta graduacin de 1oz (76ml). Estilo de vida   Trabaje con su mdico para mantenerse en un peso saludable o para perder peso. Pregntele a su mdico cul es el peso recomendable para usted.  Haga al menos 36minutos de ejercicio la [place] de  los das de la semana. Estos pueden incluir caminar, nadar o andar en bicicleta.  Realice al menos 30 minutos de ejercicio que fortalezca sus msculos (ejercicios de resistencia) al menos 3 das a la [place]. Estos pueden incluir levantar pesas o hacer [place].  No consuma ningn producto que contenga nicotina o tabaco, como cigarrillos, cigarrillos electrnicos y tabaco de Higher education careers adviser. Si necesita ayuda para dejar de fumar, consulte al [employer].  Controle su presin  arterial en su casa tal como le indic el mdico.  Concurra a todas las visitas de seguimiento como se lo haya indicado el mdico. Esto es importante. Medicamentos  [employer] de venta libre y los recetados solamente como se lo haya indicado el mdico. Siga cuidadosamente las indicaciones.  No omita las dosis de medicamentos para la presin arterial. Los medicamentos pierden eficacia si omite dosis. El hecho de omitir las dosis tambin [place] el riesgo de otros problemas.  Pregntele a su mdico a qu efectos secundarios o reacciones a los Careers information officer. Comunquese con un mdico si:  Piensa que tiene [place] reaccin a los medicamentos que est tomando.  Tiene dolores de cabeza frecuentes (recurrentes).  Se siente mareado.  Tiene hinchazn en los tobillos.  Tiene problemas de visin. Solicite ayuda inmediatamente si:  Siente un dolor de cabeza muy intenso.  Empieza a sentirse desorientado (confundido).  Se siente dbil o adormecido.  Siente que va a desmayarse.  Tiene un dolor muy intenso en las siguientes zonas: ? Pecho. ? Vientre (abdomen).  Vomita ms de una vez.  Tiene dificultad para respirar. Resumen  El trmino hipertensin es otra forma de denominar a la presin arterial elevada.  La presin arterial elevada fuerza al corazn a trabajar ms para bombear la sangre.  Para la [employer], una presin arterial normal es menor que 120/80.  Las decisiones saludables pueden ayudarle a disminuir su presin arterial. Si no puede bajar su presin arterial mediante decisiones saludables, es posible que deba tomar medicamentos. Esta informacin no tiene Marine scientist el consejo del mdico. Asegrese de hacerle al mdico cualquier pregunta que tenga. Document Released: 06/16/2009 Document Revised: 10/12/2017 Document Reviewed: 10/12/2017 Elsevier Patient Education  2020 [place] en los adultos Obesity,  Adult La obesidad es un exceso de Air traffic controller. Ser obeso significa que su peso es ms de lo que es saludable para usted. [facility] es un nmero que indica la cantidad de grasa corporal que tiene una persona. Si usted tiene un ndice de masa corporal (IMC) de 30o ms, esto significa que es obeso. A menudo, la causa de la obesidad es comer o beber ms caloras que las que el cuerpo [place]. Cambiar el estilo de vida puede ayudarlo a Sports coach de [place]. La obesidad puede causar problemas de salud graves, como los siguientes:  Accidente cerebrovascular.  Arteriopata coronaria (EAC).  Diabetes tipo 2.  Algunos tipos de cncer, incluido el cncer de colon, mama, tero y vescula.  Artrosis.  Presin arterial alta (hipertensin arterial).  Colesterol alto.  Apnea del sueo.  Clculos en la vescula.  Problemas de esterilidad. Cules son las causas?  Consumir todos los [place] con altos niveles de [place], Location manager y [place].  Nacer con genes que pueden hacerlo ms propenso a ser obeso.  Tener una afeccin mdica que causa obesidad.  Tomar ciertos medicamentos.  Permanecer mucho tiempo sentado (tener un estilo de vida sedentario).  No dormir lo suficiente.  Beber gran cantidad de bebidas con azcar. Sander Nephew  incrementa el riesgo?  Tener antecedentes familiares de obesidad.  Ser mujer afroamericana.  Ser hombre de origen hispano.  Vivir en un rea con acceso limitado a las siguientes posibilidades: ? Parques, centros recreativos o veredas. ? Alimentos saludables, como se venden en tiendas de comestibles y mercados de [place]. Cules son los signos o los sntomas? El principal signo es tener demasiada grasa corporal. Cmo se trata?  El tratamiento de esta afeccin frecuentemente incluye cambiar el estilo de vida. El tratamiento puede incluir: ? Cambios en la dieta. Esto puede incluir crear un plan de alimentacin saludable. ? Realizar actividad fsica. Puede incluir una  actividad que hace que el corazn lata ms rpido (ejercicio [place]) y Chiropodist de Teacher, early years/pre. Trabaje con su mdico para disear un programa que funcione para usted. ? Medicamentos para ayudarlo a Administrator, Civil Service. Pueden utilizarse si no puede perder 1 libra (450g) por semana despus de 6 semanas de alimentacin saludable y ms ejercicio. ? Tratar las afecciones que causan la obesidad. ? [place]. Las opciones pueden incluir bandas gstricas y bypass gstrico. Esto puede realizarse en las siguientes situaciones:  Otros tratamientos no mejoraron su afeccin.  Tiene un IMC de 40 o superior.  Tiene problemas de salud potencialmente mortales relacionados con la obesidad. Siga estas instrucciones en su casa: Comida y bebida   Siga las instrucciones del mdico respecto de las comidas y las bebidas. Su mdico puede recomendarle lo siguiente: ? Limitar las comidas rpidas, los dulces y las colaciones procesadas. ? Elegir opciones con bajo contenido de [place]. Por ejemplo, Customer service manager de [employer]. ? Consumir 5o ms porciones de frutas o verduras por da. ? Comer en casa con ms frecuencia. Esto le da ms control sobre lo que come. ? Cuando coma afuera, elija alimentos saludables. ? Aprenda a leer las etiquetas de los alimentos. Esto le ayudar a aprender qu cantidad de alimento hay en una porcin. ? Tenga a mano colaciones con bajo contenido de grasas. ? Evite las bebidas que contengan mucha azcar. Estas incluyen refrescos, jugo de frutas, t helado con azcar y [place] saborizada.  Beba suficiente agua para mantener el pis (la [place]) de color amarillo plido.  No siga las dietas de [place]. Actividad fsica  Haga ejercicios con frecuencia, como se lo haya indicado el mdico. La mayora de los adultos deben hacer hasta 128minutos de ejercicio de intensidad moderada cada semana.Pregntele al mdico lo siguiente: ? Los tipos de ejercicios que son seguros para usted. ? La  frecuencia con la que [employer] ejercicios.  Precaliente y elongue adecuadamente antes de hacer actividad fsica.  Haga un estiramiento lento despus de la actividad (relajacin).  Descanse entre los perodos de [place]. Estilo de vida  Trabaje con su mdico y con un experto en alimentacin (nutricionista) para establecer un objetivo de prdida de peso que sea adecuado para usted.  Limite el tiempo que pasa frente a una pantalla.  Busque formas de recompensarse que no incluyan alimentos.  No beba alcohol si: ? El mdico le indica que no lo haga. ? Est embarazada, puede estar embarazada o est tratando de quedar embarazada.  Si bebe alcohol: ? Limite la cantidad que bebe a lo siguiente:  De 0 a 1 medida por da para las mujeres.  De 0 a 2 medidas por da para los hombres. ? Est atento a la cantidad de alcohol que hay en las bebidas que toma. En los [place], una medida equivale a una botella de cerveza de 12oz (354ml),  un vaso de vino de 5oz (138ml) o un vaso de una bebida alcohlica de alta graduacin de 1oz (43ml). Instrucciones generales  Lleve un diario de su prdida de peso. Esto puede ayudarlo a Conservator, museum/gallery de lo siguiente: ? Los alimentos que come. ? Cunto ejercicio realiza.  Tome los medicamentos de venta libre y los recetados solamente como se lo haya indicado el [place] vitaminas y suplementos solamente como se lo haya indicado el mdico.  Considere participar en un grupo de apoyo.  Concurra a todas las visitas de seguimiento como se lo haya indicado el mdico. Esto es importante. Comunquese con un mdico si:  No puede alcanzar su objetivo de prdida de peso despus de haber modificado su dieta y su estilo de vida durante 6semanas. Solicite ayuda inmediatamente si:  Tiene dificultad para respirar.  Tiene pensamientos acerca de lastimarse. Resumen  La obesidad es un exceso de Air traffic controller.  Ser obeso significa que su  peso es ms de lo que es saludable para usted.  Trabaje con su mdico para establecer un objetivo de prdida de [place].  Haga actividad fsica con regularidad tal como le indic el mdico. Esta informacin no tiene Marine scientist el consejo del mdico. Asegrese de hacerle al mdico cualquier pregunta que tenga. Document Released: 06/28/2011 Document Revised: 09/21/2017 Document Reviewed: 09/21/2017 Elsevier Patient Education  2020 [employer].

## 2018-09-11 LAB — LIPID PANEL
Chol/HDL Ratio: 4.5 ratio — ABNORMAL HIGH (ref 0.0–4.4)
Cholesterol, Total: 243 mg/dL — ABNORMAL HIGH (ref 100–199)
HDL: 54 mg/dL (ref >39)
LDL Chol Calc (NIH): 173 mg/dL — ABNORMAL HIGH (ref 0–99)
Triglycerides: 94 mg/dL (ref 0–149)
VLDL Cholesterol Cal: 16 mg/dL (ref 5–40)

## 2018-09-11 LAB — CBC
Hematocrit: 45.5 % (ref 34.0–46.6)
Hemoglobin: 14.8 g/dL (ref 11.1–15.9)
MCH: 29.5 pg (ref 26.6–33.0)
MCHC: 32.5 g/dL (ref 31.5–35.7)
MCV: 91 fL (ref 79–97)
Platelets: 234 10*3/uL (ref 150–450)
RBC: 5.01 x10E6/uL (ref 3.77–5.28)
RDW: 13.5 % (ref 11.7–15.4)
WBC: 10.1 10*3/uL (ref 3.4–10.8)

## 2018-09-11 LAB — BASIC METABOLIC PANEL
BUN/Creatinine Ratio: 27 — ABNORMAL HIGH (ref 9–23)
BUN: 17 mg/dL (ref 6–24)
CO2: 24 mmol/L (ref 20–29)
Calcium: 9.8 mg/dL (ref 8.7–10.2)
Chloride: 101 mmol/L (ref 96–106)
Creatinine, Ser: 0.63 mg/dL (ref 0.57–1.00)
GFR calc Af Amer: 126 mL/min/{1.73_m2} (ref >59)
GFR calc non Af Amer: 109 mL/min/{1.73_m2} (ref >59)
Glucose: 103 mg/dL — ABNORMAL HIGH (ref 65–99)
Potassium: 4.7 mmol/L (ref 3.5–5.2)
Sodium: 138 mmol/L (ref 134–144)

## 2018-09-21 ENCOUNTER — Ambulatory Visit: Payer: Self-pay | Attending: Family Medicine

## 2018-09-21 ENCOUNTER — Other Ambulatory Visit: Payer: Self-pay

## 2018-09-21 ENCOUNTER — Ambulatory Visit: Payer: No Typology Code available for payment source

## 2018-10-08 ENCOUNTER — Other Ambulatory Visit: Payer: Self-pay

## 2018-10-08 ENCOUNTER — Ambulatory Visit (HOSPITAL_COMMUNITY)
Admission: RE | Admit: 2018-10-08 | Discharge: 2018-10-08 | Disposition: A | Discharge status: Home / Self Care | Payer: Self-pay | Source: Ambulatory Visit | Attending: Nurse Practitioner | Admitting: Nurse Practitioner

## 2018-10-08 ENCOUNTER — Other Ambulatory Visit: Payer: Self-pay | Admitting: Nurse Practitioner

## 2018-10-08 DIAGNOSIS — M545 Low back pain, unspecified: Secondary | ICD-10-CM

## 2018-10-08 DIAGNOSIS — G8929 Other chronic pain: Secondary | ICD-10-CM

## 2018-10-22 ENCOUNTER — Other Ambulatory Visit: Payer: Self-pay

## 2018-10-22 ENCOUNTER — Encounter: Payer: Self-pay | Admitting: Nurse Practitioner

## 2018-10-22 ENCOUNTER — Ambulatory Visit: Payer: No Typology Code available for payment source | Admitting: Pharmacist

## 2018-10-22 ENCOUNTER — Ambulatory Visit: Payer: Self-pay | Attending: Nurse Practitioner | Admitting: Nurse Practitioner

## 2018-10-22 VITALS — BP 139/85 | HR 115 | Temp 99.0°F | Ht 64.0 in | Wt 230.4 lb

## 2018-10-22 DIAGNOSIS — Z6839 Body mass index (BMI) 39.0-39.9, adult: Secondary | ICD-10-CM

## 2018-10-22 DIAGNOSIS — Z23 Encounter for immunization: Secondary | ICD-10-CM

## 2018-10-22 DIAGNOSIS — E669 Obesity, unspecified: Secondary | ICD-10-CM

## 2018-10-22 DIAGNOSIS — G8929 Other chronic pain: Secondary | ICD-10-CM

## 2018-10-22 DIAGNOSIS — M545 Low back pain: Secondary | ICD-10-CM

## 2018-10-22 MED ORDER — PHENTERMINE HCL 37.5 MG PO TABS: 37.5000 mg | ORAL_TABLET | Freq: Every day | ORAL | 1 refills | Status: DC

## 2018-10-22 NOTE — Progress Notes (Signed)
Assessment & Plan:  Kristin [name] was seen today for follow-up.  Diagnoses and all orders for this visit:  Obesity, Class II, BMI 35-39.9 -     phentermine (ADIPEX-P) 37.5 MG tablet; Take 1 tablet (37.5 mg total) by mouth daily before breakfast.  Chronic bilateral low back pain without sciatica Apparently she did not receive her my chart message regarding her x-ray a few weeks ago.  Results were given in the office today and she was instructed that she had been referred to [facility]. Work on losing weight to help reduce back pain. May alternate with heat and ice application for pain relief. May also alternate with acetaminophen and Ibuprofen as prescribed for back pain. Other alternatives include massage, acupuncture and water aerobics.  You must stay active and avoid a sedentary lifestyle.      Patient has been counseled on age-appropriate routine health concerns for screening and prevention. These are reviewed and up-to-date. Referrals have been placed accordingly. Immunizations are up-to-date or declined.    Subjective:   Chief Complaint  Patient presents with   Follow-up    Pt. is here for weight check.    HPI Kristin [name] 43 y.o. female presents to office today for weight check. VRI was used to communicate directly with patient for the entire encounter including providing detailed patient instructions.    Obesity She is currently taking phentermine 37.5 mg daily. Endorses bloating sensation when she sometimes takes the medication. But this is not occurring often. She does also note intermittent constipation which can be a side effect of the phentermine. I have instructed her to increase her water intake and if constipation worsens she will need to stop phentermine.  Heart rate is elevated today however she has blood pressure and heart rate readings from home on her phone today with heart rate in the 80s.  Blood pressure is well controlled. Denies chest pain, shortness of breath,  palpitations, lightheadedness, dizziness, headaches or BLE edema.   Review of Systems  Constitutional: Negative for fever, malaise/fatigue and weight loss.  HENT: Negative.  Negative for nosebleeds.   Eyes: Negative.  Negative for blurred vision, double vision and photophobia.  Respiratory: Negative.  Negative for cough and shortness of breath.   Cardiovascular: Negative.  Negative for chest pain, palpitations and leg swelling.  Gastrointestinal: Negative.  Negative for heartburn, nausea and vomiting.  Musculoskeletal: Negative.  Negative for myalgias.  Neurological: Negative.  Negative for dizziness, focal weakness, seizures and headaches.  Psychiatric/Behavioral: Negative for suicidal ideas. The patient is nervous/anxious.     Past Medical History:  Diagnosis Date   Ectopic pregnancy    Nephrolithiasis     Past Surgical History:  Procedure Laterality Date   etopic pregnancy      Family History  Problem Relation Age of Onset   Breast cancer Neg Hx     Social History Reviewed with no changes to be made today.   Outpatient Medications Prior to Visit  Medication Sig Dispense Refill   Calcium Carbonate Antacid (TUMS PO) Take by mouth.     omeprazole (PRILOSEC) 20 MG capsule Take 20 mg by mouth daily.     phentermine (ADIPEX-P) 37.5 MG tablet Take 1 tablet (37.5 mg total) by mouth daily before breakfast. 30 tablet 1   No facility-administered medications prior to visit.     Allergies  Allergen Reactions   Wellbutrin [Bupropion] Rash       Objective:    BP 139/85 (BP Location: Left Arm, Patient Position: Sitting,  Cuff Size: Large)    Pulse (!) 115    Temp 99 F (37.2 C) (Oral)    Ht 5\' 4"  (1.626 m)    Wt 230 lb 6.4 oz (104.5 kg)    SpO2 99%    BMI 39.55 kg/m  Wt Readings from Last 3 Encounters:  10/22/18 230 lb 6.4 oz (104.5 kg)  09/10/18 243 lb (110.2 kg)  03/14/18 228 lb 12.8 oz (103.8 kg)    Physical Exam Vitals signs and nursing note reviewed.    Constitutional:      Appearance: She is well-developed.  HENT:     Head: Normocephalic and atraumatic.  Neck:     Musculoskeletal: Normal range of motion.  Cardiovascular:     Rate and Rhythm: Regular rhythm. Tachycardia present.     Heart sounds: Normal heart sounds. No murmur. No friction rub. No gallop.   Pulmonary:     Effort: Pulmonary effort is normal. No tachypnea or respiratory distress.     Breath sounds: Normal breath sounds. No decreased breath sounds, wheezing, rhonchi or rales.  Chest:     Chest wall: No tenderness.  Abdominal:     General: Bowel sounds are normal.     Palpations: Abdomen is soft.  Musculoskeletal: Normal range of motion.  Skin:    General: Skin is warm and dry.  Neurological:     Mental Status: She is alert and oriented to person, place, and time.     Coordination: Coordination normal.  Psychiatric:        Behavior: Behavior normal. Behavior is cooperative.        Thought Content: Thought content normal.        Judgment: Judgment normal.        Patient has been counseled extensively about nutrition and exercise as well as the importance of adherence with medications and regular follow-up. The patient was given clear instructions to go to ER or return to medical center if symptoms don't improve, worsen or new problems develop. The patient verbalized understanding.   Follow-up: Return in about 2 months (around 12/22/2018) for weight check.   Gildardo Pounds, FNP-BC [facility] and [facility] [place], [place]   10/22/2018, 11:17 AM

## 2018-10-22 NOTE — Progress Notes (Signed)
Patient presents for vaccination against influenza per orders of Zelda. Consent given. Counseling provided. No contraindications exists. Vaccine administered without incident.   

## 2018-10-22 NOTE — Patient Instructions (Signed)
Dolor de espalda crnico Chronic Back Pain Cuando el dolor en la espalda dura ms de 3 meses, se denomina dolor de espalda crnico. El dolor puede empeorar en ciertos momentos (exacerbaciones). Hay cosas que puede hacer en su casa para Administrator, sports. Siga estas indicaciones en su casa: Actividad      Evite agacharse y Optometrist otras actividades que Sales executive.  Cuando est de pie: ? Mantenga la parte alta de la espalda y el cuello rectos. ? Mantenga los hombros [employer]. ? Evite encorvarse.  Cuando est sentado: ? Mantenga la espalda recta. ? Relaje los hombros. No curve los hombros ni los [employer].  No permanezca sentado o de pie en el mismo lugar durante mucho tiempo.  [place], haga pausas breves para descansar. Por lo general, recostarse o Public affairs consultant de pie es mejor que permanecer sentado. Descansar puede ayudar a Best boy.  Cuando est sentado o de pie por [employer], haga un poco de [place] o ejercicios de estiramiento. Esto ayudar a Mining engineer rigidez y Conservation officer, historic buildings.  Realice actividad fsica con regularidad. Pregntele al mdico qu actividades son seguras para usted.  No levante ningn objeto que pese ms de 10libras (4,5kg). Para evitar lesionarse cuando levanta objetos: ? [place]. ? Mantenga el peso cerca del cuerpo. ? No gire el cuerpo. Control del dolor  Si se lo indican, aplique hielo sobre la zona dolorida. El mdico puede indicarle que use hielo durante las 24 a 91 horas luego del comienzo de Physiological scientist. ? Ponga el hielo en una bolsa plstica. ? Coloque una [employer] piel y la bolsa de hielo. ? Coloque el hielo durante 42mnutos, 2 a 3veces por da.  Si se lo indican, aplique calor en la zona dolorida con la frecuencia que le indique el mdico. Use la fuente de calor que el mdico le recomiende, como una compresa de calor hmedo o una almohadilla trmica. ? Coloque una tGenuine Partspiel  y la fuente de cFreight forwarder ? Aplique el calor durante 20 a 369mutos. ? Retire la fuente de calor si la piel se pone de color rojo brillante. Esto es muy importante si no puede seEducation officer, environmentalcalor o fro. Puede correr un riesgo mayor de sufrir quemaduras.  Sumrjase en un bao clido. Esto puede ayudar a alBest boy [place]ome los medicamentos de venta libre y los recetados solamente como se lo haya indicado el mdico. Instrucciones generales  Duerma sobre un coRiverdaleIntente acostarse de costado, con las rodillas ligeramente flexionadas. Si se recuesta soSmith Internationalcoloque una almohada debajo de las rodillas.  Concurra a todas las visitas de seguimiento como se lo haya indicado el mdico. Esto es importante. Comunquese con un mdico si:  Tiene un dolor que no mejora con descanso ni medicamentos. Solicite ayuda de inmediato si:  Siente debilidad en uno o ambos brazos o piernas.  Pierde la sensibilidad (adormecimiento) en uno o ambos brazos o piernas.  Le cuesta controlar cundo defecar (tener deposiciones) o haField seismologistis (orinar).  Siente malestar estomacal (nuseas).  Vomita.  Tiene dolor de vientre (abdominal).  Le falta el aire.  Pierde el conocimiento (se desmaya). Resumen  Cuando el dolor en la espalda dura ms de 3 meses, se denomina dolor de espalda crnico.  El dolor puede empeorar en ciertos momentos (exacerbaciones).  Use hielo y calor segn le indique el mdico. El mdico puede indicarle que use hielo luego del comienzo de  las exacerbaciones. Esta informacin no tiene Marine scientist el consejo del mdico. Asegrese de hacerle al mdico cualquier pregunta que tenga. Document Released: 07/12/2010 Document Revised: 03/23/2017 Document Reviewed: 10/24/2016 Elsevier Patient Education  2020 [employer].

## 2018-10-24 ENCOUNTER — Encounter: Payer: Self-pay | Admitting: Physical Medicine and Rehabilitation

## 2018-10-24 ENCOUNTER — Other Ambulatory Visit: Payer: Self-pay

## 2018-10-24 ENCOUNTER — Encounter: Payer: Self-pay | Attending: Physical Medicine and Rehabilitation | Admitting: Physical Medicine and Rehabilitation

## 2018-10-24 VITALS — BP 133/85 | HR 97 | Temp 97.5°F | Ht 64.0 in | Wt 229.0 lb

## 2018-10-24 DIAGNOSIS — Z6841 Body Mass Index (BMI) 40.0 and over, adult: Secondary | ICD-10-CM | POA: Insufficient documentation

## 2018-10-24 DIAGNOSIS — M5442 Lumbago with sciatica, left side: Secondary | ICD-10-CM | POA: Insufficient documentation

## 2018-10-24 DIAGNOSIS — G8929 Other chronic pain: Secondary | ICD-10-CM | POA: Insufficient documentation

## 2018-10-24 MED ORDER — DICLOFENAC SODIUM 75 MG PO TBEC: 75.0000 mg | DELAYED_RELEASE_TABLET | Freq: Two times a day (BID) | ORAL | 5 refills | Status: DC | PRN

## 2018-10-24 NOTE — Progress Notes (Signed)
Subjective:    Patient ID: Kristin [name], female    DOB: 1975-12-04, 43 y.o.   MRN: [id number]  HPI CC: chronic low back pain  Patient is a 43 yr old female with hx of obesity on Phentermine and Chronic low back pain here for evaluation.   No trauma- since fall at age 18 Has had back pain  Intermittently since age 35-   Would use heating pads back.  Has been going to chiropractor x 1+ years- but 1 week later, it hurts again. Dull aching pain Sharp when the pinching happens- when turns, it's sharp, ex: when stands up intermittently- even to breathe hurts. It shoots down LLE- Goes down to just below the knee intermittently Starts midline low back/L4-S1 area (by pointing) and goes near anus- then radiates into L buttock down LLE. Marland Kitchen   Even gets tired on upper chest- chest pops occ.  On L side of low back - hard even to do cleaning/dishes at home- at end of cleaning, leans on counter-   Constantly needs to stretch- feels like needs to all the time. Lately feeling OK. But goes through flares. Lives with constant fear will hurt again. If laughs or breaths, pain is worse when it's occurring.   Never had oral steroids or steroid injections- has had NSAIDs- takes Advil- 2-3 tabs- helps some-  No bowel or bladder issues.  Pain better x 3 weeks- haven't been going to chiropractor last weeks, fyi.   Social Hx: Lives in [place]- 1st floor- goes up 10 stairs to get into 1st floor apartment. Nonsmoker- no alcohol; denies illicits Walks slow on treadmill- 3x/week sometimes 4x/week. Does really slow- 1000 steps max. Sometimes ~200 steps. Not working- due to pain.    Pain Inventory Average Pain 6 Pain Right Now 0 My pain is dull  In the last 24 hours, has pain interfered with the following? General activity 0 Relation with others 0 Enjoyment of life 0 What TIME of day is your pain at its worst? . Sleep (in general) Fair  Pain is worse with: walking, bending, sitting and  standing Pain improves with: . Relief from Meds: 5  Mobility ability to climb steps?  yes do you drive?  no  Function not employed: date last employed .  Neuro/Psych anxiety  Prior Studies Any changes since last visit?  no  Physicians involved in your care Any changes since last visit?  no   Family History  Problem Relation Age of Onset  . Breast cancer Neg Hx    Social History   Socioeconomic History  . Marital status: Single    Spouse name: Not on file  . Number of children: 3  . Years of education: Not on file  . Highest education level: 6th grade  Occupational History  . Not on file  Social Needs  . Financial resource strain: Not on file  . Food insecurity    Worry: Not on file    Inability: Not on file  . Transportation needs    Medical: No    Non-medical: No  Tobacco Use  . Smoking status: Never Smoker  . Smokeless tobacco: Never Used  Substance and Sexual Activity  . Alcohol use: Yes    Comment: occasionally   . Drug use: No  . Sexual activity: Yes  Lifestyle  . Physical activity    Days per week: Not on file    Minutes per session: Not on file  . Stress: Not on file  Relationships  .  Social Herbalist on phone: Not on file    Gets together: Not on file    Attends religious service: Not on file    Active member of club or organization: Not on file    Attends meetings of clubs or organizations: Not on file    Relationship status: Not on file  Other Topics Concern  . Not on file  Social History Narrative  . Not on file   Past Surgical History:  Procedure Laterality Date  . etopic pregnancy     Past Medical History:  Diagnosis Date  . Ectopic pregnancy   . Nephrolithiasis    BP 133/85   Pulse 97   Temp (!) 97.5 F (36.4 C)   Ht 5\' 4"  (1.626 m)   Wt 229 lb (103.9 kg)   LMP 10/14/2018   SpO2 99%   BMI 39.31 kg/m   Opioid Risk Score:   Fall Risk Score:  `1  Depression screen PHQ 2/9  Depression screen [facility] 2/9  10/22/2018 09/10/2018 05/09/2018 03/14/2018 10/18/2017 02/22/2017 12/06/2016  Decreased Interest 0 0 0 0 0 0 0  Down, Depressed, Hopeless 0 0 2 1 0 0 0  PHQ - 2 Score 0 0 2 1 0 0 0  Altered sleeping 0 0 1 0 1 0 1  Tired, decreased energy 0 0 0 0 1 0 1  Change in appetite 0 0 0 0 0 0 0  Feeling bad or failure about yourself  0 0 0 0 0 0 0  Trouble concentrating 0 0 0 0 0 0 0  Moving slowly or fidgety/restless 0 0 0 0 0 0 0  Suicidal thoughts 0 0 0 0 0 0 0  PHQ-9 Score 0 0 3 1 2  0 2     Review of Systems  Constitutional: Negative.   HENT: Negative.   Eyes: Negative.   Respiratory: Negative.   Cardiovascular: Negative.   Gastrointestinal: Negative.   Endocrine: Negative.   Genitourinary: Negative.   Musculoskeletal: Positive for arthralgias, back pain and myalgias.  Skin: Negative.   Allergic/Immunologic: Negative.   Neurological: Negative.   Hematological: Negative.   Psychiatric/Behavioral: The patient is nervous/anxious.   All other systems reviewed and are negative.      Objective:   Physical Exam Awake, alert, appropriate, accompanied by interpretor, sitting and standing back and forth, NAD  MS: 5/5 in LEs B/L TTP over MIDLINE L4-S1 Increased pain with L hyperextension not R No pain with lumbar flexion  (-) SLR B/L   Neuro: No decrease in LT in L1-S2 distribution B/L DTRs 2+ in patella and ankle B/L No clonus or increased tone in LEs     Assessment & Plan:   Patient is a 43 yr old female with L4-S1 chronic low back pain due to disc narrowing-    1. Needs to do Home exercise program (HEP) 5 DAYS/week- no matter what. Suggest back strengthening exercises. Printed them for pt in spanish   2. Diclofenac 75 mg 2x/day as needed- don't take Advil at the same time.  3. Will refer to Dr Letta Pate for epidural steroid injections- NOT having pain currently- wait til having pain.  4. If Diclofenac not effective, can try Duloxetine- for nerve pain   Duloxetine /Cymbalta  30 mg nightly x 1 week  Then 60 mg nightly- for nerve pain  1% of patients can have nausea with Duloxetine- call me if needs an anti-nausea medicine. Can also cause mild dry mouth/dry eyes and  mild constipation.  5. Don't see that chiropractor anymore.  6. F/U in 2 months  I spent a total of 45 minutes on appointment- more than 30 minutes going over plan, spanish/intrepretor took a little longer.

## 2018-10-24 NOTE — Patient Instructions (Addendum)
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 Needs to do Home exercise program (HEP) 5 DAYS/week- no matter what. Suggest back strengthening exercises.  Rehabilitacin para la citica Sciatica Rehab Pregunte al mdico qu ejercicios son seguros para usted. Haga los ejercicios exactamente como se lo haya indicado el mdico y gradelos como se lo hayan indicado. Es normal sentir un estiramiento leve, tironeo, opresin o Tree surgeon al [employer] [place]. Detngase de inmediato si siente un dolor repentino o Printmaker. No comience a hacer estos ejercicios hasta que se lo indique el mdico. Ejercicios de elongacin y amplitud de movimiento Estos ejercicios calientan los msculos y las articulaciones, y mejoran el movimiento y la flexibilidad de la cadera y la espalda. Estos ejercicios tambin ayudan a Best boy, el adormecimiento y el hormigueo. Deslizamiento del nervio citico 1. Sintese en una silla con la cabeza hacia abajo en direccin al pecho. Coloque las manos detrs de la espalda. Deje caer los hombros hacia adelante. 2. Extienda lentamente una de las piernas mientras inclina la cabeza hacia atrs como si estuviera mirando hacia el techo. Solo extienda la pierna tan lejos como pueda sin empeorar los sntomas. 3. Mantenga esta posicin durante __________ segundos. 4. Vuelva lentamente a la posicin inicial. 5. Repita el ejercicio con la otra pierna. Repita __________ veces. Realice este ejercicio __________ veces al da. Rodilla al pecho con aduccin de cadera y rotacin interna   1. Acustese boca arriba en una superficie firme con las piernas extendidas. 2. Flexione una rodilla y llvela hacia el pecho hasta que sienta un estiramiento suave en la parte inferior de la espalda y las nalgas. A continuacin, mueva la rodilla hacia el hombro que est en el lado contrario de la pierna. Esto se denomina rotacin interna y  aduccin de la cadera. ? Mantenga la pierna en esta posicin tomando la parte frontal de la rodilla. 3. Mantenga esta posicin durante __________ segundos. 4. Vuelva lentamente a la posicin inicial. 5. Repita el ejercicio con la otra pierna. Repita __________ veces. Realice este ejercicio __________ veces al da. Extensin [employer] codos, en decbito prono   1. Acustese boca abajo sobre una superficie firme. La cama puede ser demasiado blanda para este ejercicio. 2. Apyese sobre los codos. 3. Con los brazos, aydese a Counselling psychologist sentir un leve estiramiento en el abdomen y la parte inferior de la espalda. ? Administrator, Civil Service de [employer] codos. Si no se siente cmodo, intente colocando almohadas debajo del pecho. ? Debe dejar la cadera inmvil sobre la superficie en la que est apoyado. Mantenga la cadera y los msculos de la espalda relajados. 4. Mantenga esta posicin durante __________ segundos. 5. Afloje lentamente la parte superior del cuerpo y vuelva a la posicin inicial. Repita __________ veces. Realice este ejercicio __________ veces al da. Ejercicios de fortalecimiento Estos ejercicios fortalecen la espalda y le otorgan resistencia. La resistencia es la capacidad de usar los msculos durante un tiempo prolongado, incluso despus de que se cansen. Inclinacin de la pelvis Este ejercicio fortalece los msculos que se encuentran en la parte profunda del abdomen. 1. Acustese boca arriba sobre una superficie firme. Doble las rodillas y [employer] pies planos sobre el piso. 2. Tensione los msculos abdominales. Eleve la pelvis hacia el techo y aplane la parte inferior de la espalda contra el suelo. ? Para realizar este ejercicio, puede colocar [employer]  toalla pequea debajo de la parte inferior de la espalda y presionar la espalda contra la toalla. 3. Mantenga esta posicin durante __________ segundos. 4. Relaje totalmente los msculos antes de repetir el  ejercicio. Repita __________ veces. Realice este ejercicio __________ veces al da. Elevaciones alternadas de pierna y brazo   1. [place] manos y las rodillas sobre una superficie firme. Si est sobre un suelo duro, puede usar un elemento acolchado, como una alfombrilla para ejercicios, para apoyar las rodillas. 2. Alinee los brazos y las piernas. Las manos deben estar justo debajo de los hombros, y las rodillas debajo de la cadera. 3. Eleve la pierna izquierda hacia atrs. Al mismo tiempo, eleve el brazo derecho y Engineer, petroleum frente a usted. ? No eleve la pierna por encima de la cadera. ? No eleve el brazo por encima del hombro. ? Mantenga los msculos del abdomen y de la espalda contrados. ? Mantenga la cadera [employer] el suelo. ? No arquee la espalda. ? Mantenga el equilibrio con cuidado y no contenga la respiracin. 4. Mantenga esta posicin durante __________ segundos. 5. Vuelva lentamente a la posicin inicial. 6. Repita con su pierna derecha y su brazo izquierdo. Repita __________ veces. Realice este ejercicio __________ veces al da. Postura y Chad corporal La buena postura y la Engineer, agricultural corporal saludable pueden ayudar a Theatre stage manager estrs en las articulaciones y los tejidos del cuerpo. La Therapist, nutritional se refiere a los movimientos y a las posiciones del cuerpo mientras realiza las actividades diarias. La postura es una parte de la Therapist, nutritional. Ardelia Mems buena postura significa que:  La columna est en su posicin natural de curvatura en S (neutral).  Los hombros estn [employer].  La cabeza no est inclinada hacia adelante. Siga esas pautas para mejorar la postura y Quarry manager en sus actividades diarias. De pie    Al estar de pie, mantenga la columna en la posicin neutral y los pies separados al ancho de caderas, aproximadamente. Mantenga las rodillas ligeramente flexionadas. Las [place], los hombros y las caderas deben estar  alineados.  Cuando realice una tarea en la que deba estar de pie en el mismo sitio durante mucho tiempo, coloque un pie en un objeto estable de 2 a 4pulgadas (5a 10cm) de alto, como un taburete. Esto ayuda a que la columna mantenga una posicin neutral. Sentado    Cuando est sentado, mantenga la columna en posicin neutral y deje los pies apoyados en el suelo. Use un apoyapis, si es necesario, y [employer] muslos paralelos al suelo. Evite redondear los hombros e inclinar la cabeza hacia adelante.  Cuando trabaje en un escritorio o con una computadora, el escritorio debe estar a una altura en la que las manos estn un poco ms abajo que los codos. Deslice la silla debajo del escritorio, de modo de estar lo suficientemente cerca como para mantener una buena [place].  Cuando trabaje con una computadora, coloque el monitor a una altura que le permita mirar derecho hacia adelante, sin tener que inclinar la cabeza hacia adelante o [place] atrs. Reposo  Al descansar o estar acostado, evite las posiciones que le causen ms dolor.  Si siente dolor al [employer] que exigen sentarse, inclinarse, agacharse o ponerse en cuclillas, acustese en una posicin en la que el cuerpo no deba doblarse mucho. Por ejemplo, evite acurrucarse de costado con los brazos y las rodillas cerca del pecho (posicin fetal).  Si siente dolor con las [employer]  estar de pie durante mucho tiempo o estirar los [place], acustese con la columna en una posicin neutral y flexione ligeramente las rodillas. Pruebe con las siguientes posiciones: ? Acupuncturist de costado con una almohada entre las rodillas. ? Acostarse boca arriba con una almohada debajo de las rodillas. Levantar objetos    Cuando tenga que levantar un objeto, mantenga los pies separados el ancho de los hombros, como [employer], y apriete los msculos abdominales.  [place] y la cadera, y Quarry manager la columna en posicin neutral. Es  importante levantarse utilizando la fuerza de las piernas, no de la espalda. No trabe las rodillas hacia afuera.  Siempre pida ayuda a otra persona para levantar objetos pesados o incmodos. Esta informacin no tiene Marine scientist el consejo del mdico. Asegrese de hacerle al mdico cualquier pregunta que tenga. Document Released: 12/15/2008 Document Revised: 02/21/2018 Document Reviewed: 02/21/2018 Elsevier Patient Education  [place].     2. Diclofenac 75 mg 2x/day as needed- don't take Advil at the same time. If take too much, can cause kidney issues long term.   3. Will refer to Dr Letta Pate for epidural steroid injections- NOT having pain currently- wait til having pain.  4. If Diclofenac not effective, can try Duloxetine- for nerve pain   Duloxetine /Cymbalta 30 mg nightly x 1 week  Then 60 mg nightly- for nerve pain  1% of patients can have nausea with Duloxetine- call me if needs an anti-nausea medicine. Can also cause mild dry mouth/dry eyes and mild constipation.  5. Don't see that chiropractor anymore.  6. F/U in 2 months

## 2018-11-13 ENCOUNTER — Telehealth: Payer: Self-pay | Admitting: Nurse Practitioner

## 2018-11-13 NOTE — Telephone Encounter (Signed)
Must come from PCP.

## 2018-11-13 NOTE — Telephone Encounter (Signed)
1) Medication(s) Requested (by name): -phentermine (ADIPEX-P) 37.5 MG tablet   2) Pharmacy of Choice: -[facility], [place] [address]  3.)Special request: Pt states her pharmacy does not have any refills available,please follow up

## 2018-11-15 NOTE — Telephone Encounter (Signed)
1) Medication(s) Requested (by name): -phentermine (ADIPEX-P) 37.5 MG tablet   2) Pharmacy of Choice: -[facility], [place] [address]  3.)Special request: Pt states her pharmacy does not have any refills available,please follow up

## 2018-11-17 NOTE — Telephone Encounter (Signed)
She was prescribed one refill after the 30 day. First fill on 10-12 and second refill should have been 11-12. She was supposed to make a December appointment for additional refills after weight check. The pharmacy should have the refill for November.

## 2018-11-18 ENCOUNTER — Other Ambulatory Visit: Payer: Self-pay | Admitting: Nurse Practitioner

## 2018-11-18 DIAGNOSIS — E669 Obesity, unspecified: Secondary | ICD-10-CM

## 2018-11-18 MED ORDER — PHENTERMINE HCL 37.5 MG PO TABS: 37.5000 mg | ORAL_TABLET | Freq: Every day | ORAL | 1 refills | Status: DC

## 2018-11-19 ENCOUNTER — Telehealth: Payer: Self-pay | Admitting: Nurse Practitioner

## 2018-11-19 NOTE — Telephone Encounter (Signed)
Patient called saying that she was prescribed phentermine (ADIPEX-P) 37.5 MG tablet  And her pharmacy is not able to fill the Rx because the DEA of her PCP has expired. Had already talked to [facility] and she had already renewed it. Can you please send the medication over to the pharmacy for the patient?

## 2018-11-19 NOTE — Telephone Encounter (Signed)
Pt was informed that the medicaid was sent to [facility] and to check with them since the PCP already sent it on the date of the appt

## 2018-11-20 NOTE — Telephone Encounter (Signed)
I am unsure what this about. Can you please look into this? Thanks.

## 2018-11-20 NOTE — Telephone Encounter (Signed)
The medication was sent on the 8th. My DEA is valid. She needs to speak with the pharmacy. Not sure what else to do.

## 2018-12-11 ENCOUNTER — Other Ambulatory Visit: Payer: Self-pay

## 2018-12-11 DIAGNOSIS — Z20822 Contact with and (suspected) exposure to covid-19: Secondary | ICD-10-CM

## 2018-12-13 LAB — NOVEL CORONAVIRUS, NAA: SARS-CoV-2, NAA: NOT DETECTED

## 2018-12-26 ENCOUNTER — Ambulatory Visit: Payer: No Typology Code available for payment source | Admitting: Physical Medicine and Rehabilitation

## 2019-01-01 ENCOUNTER — Ambulatory Visit: Payer: Self-pay | Attending: Nurse Practitioner | Admitting: Nurse Practitioner

## 2019-01-01 ENCOUNTER — Ambulatory Visit: Payer: No Typology Code available for payment source | Admitting: Nurse Practitioner

## 2019-01-01 ENCOUNTER — Encounter: Payer: Self-pay | Admitting: Nurse Practitioner

## 2019-01-01 ENCOUNTER — Other Ambulatory Visit: Payer: Self-pay

## 2019-01-01 DIAGNOSIS — E669 Obesity, unspecified: Secondary | ICD-10-CM

## 2019-01-01 DIAGNOSIS — Z6835 Body mass index (BMI) 35.0-35.9, adult: Secondary | ICD-10-CM

## 2019-01-01 DIAGNOSIS — Z5181 Encounter for therapeutic drug level monitoring: Secondary | ICD-10-CM

## 2019-01-01 MED ORDER — PHENTERMINE HCL 37.5 MG PO TABS: 37.5000 mg | ORAL_TABLET | Freq: Every day | ORAL | 2 refills | Status: DC

## 2019-01-01 NOTE — Progress Notes (Signed)
Virtual Visit via Telephone Note Due to national recommendations of social distancing due to [place] 19, telehealth visit is felt to be most appropriate for this patient at this time.  I discussed the limitations, risks, security and privacy concerns of performing an evaluation and management service by telephone and the availability of in person appointments. I also discussed with the patient that there may be a patient responsible charge related to this service. The patient expressed understanding and agreed to proceed.    I connected with Kristin [name] on 01/01/19  at   1:50 PM EST  EDT by telephone and verified that I am speaking with the correct person using two identifiers.   Consent I discussed the limitations, risks, security and privacy concerns of performing an evaluation and management service by telephone and the availability of in person appointments. I also discussed with the patient that there may be a patient responsible charge related to this service. The patient expressed understanding and agreed to proceed.   Location of Patient: Private Residence   Location of Provider: [place] and [place] participating in Telemedicine visit: [name] FNP-BC [place] interpreter [place] [id number] [place]   History of Present Illness: Telemedicine visit for: Weight Management  She weighed herself today and weight is currently 10lbs lower with phentermine. She is not exercising as much as I have recommended. States she will start working out more but her back pain limits her activity at times.  Wt Readings from Last 3 Encounters:  01/01/19 220 lb 6.4 oz (100 kg)  10/24/18 229 lb (103.9 kg)  10/22/18 230 lb 6.4 oz (104.5 kg)    Past Medical History:  Diagnosis Date  . Ectopic pregnancy   . Nephrolithiasis     Past Surgical History:  Procedure Laterality Date  . etopic pregnancy      Family History  Problem  Relation Age of Onset  . Breast cancer Neg Hx     Social History   Socioeconomic History  . Marital status: Single    Spouse name: Not on file  . Number of children: 3  . Years of education: Not on file  . Highest education level: 6th grade  Occupational History  . Not on file  Tobacco Use  . Smoking status: Never Smoker  . Smokeless tobacco: Never Used  Substance and Sexual Activity  . Alcohol use: Yes    Comment: occasionally   . Drug use: No  . Sexual activity: Yes  Other Topics Concern  . Not on file  Social History Narrative  . Not on file   Social Determinants of Health   Financial Resource Strain:   . Difficulty of Paying Living Expenses: Not on file  Food Insecurity:   . Worried About Charity fundraiser in the Last Year: Not on file  . Ran Out of Food in the Last Year: Not on file  Transportation Needs: No Transportation Needs  . Lack of Transportation (Medical): No  . Lack of Transportation (Non-Medical): No  Physical Activity:   . Days of Exercise per Week: Not on file  . Minutes of Exercise per Session: Not on file  Stress:   . Feeling of Stress : Not on file  Social Connections:   . Frequency of Communication with Friends and Family: Not on file  . Frequency of Social Gatherings with Friends and Family: Not on file  . Attends Religious Services: Not on file  .  Active Member of Clubs or Organizations: Not on file  . Attends Archivist Meetings: Not on file  . Marital Status: Not on file     Observations/Objective: Awake, alert and oriented x 3   Review of Systems  Constitutional: Negative for fever, malaise/fatigue and weight loss.  HENT: Negative.  Negative for nosebleeds.   Eyes: Negative.  Negative for blurred vision, double vision and photophobia.  Respiratory: Negative.  Negative for cough and shortness of breath.   Cardiovascular: Negative.  Negative for chest pain, palpitations and leg swelling.  Gastrointestinal: Negative.   Negative for heartburn, nausea and vomiting.  Musculoskeletal: Negative.  Negative for myalgias.  Neurological: Negative.  Negative for dizziness, focal weakness, seizures and headaches.  Psychiatric/Behavioral: Negative.  Negative for suicidal ideas.    Assessment and Plan: Kristin [name] was seen today for follow-up.  Diagnoses and all orders for this visit:  Obesity, Class II, BMI 35-39.9 -     phentermine (ADIPEX-P) 37.5 MG tablet; Take 1 tablet (37.5 mg total) by mouth daily before breakfast. Discussed diet and exercise for person with BMI >37. Instructed: You must burn more calories than you eat. Losing 5 percent of your body weight should be considered a success. In the longer term, losing more than 15 percent of your body weight and staying at this weight is an extremely good result. However, keep in mind that even losing 5 percent of your body weight leads to important health benefits, so try not to get discouraged if you're not able to lose more than this. Will recheck weight in 3-6 months.   Follow Up Instructions Return in about 2 months (around 03/04/2019) for WEIGHT CHECK.     I discussed the assessment and treatment plan with the patient. The patient was provided an opportunity to ask questions and all were answered. The patient agreed with the plan and demonstrated an understanding of the instructions.   The patient was advised to call back or seek an in-person evaluation if the symptoms worsen or if the condition fails to improve as anticipated.  I provided 14 minutes of non-face-to-face time during this encounter including median intraservice time, reviewing previous notes, labs, imaging, medications and explaining diagnosis and management.  Gildardo Pounds, FNP-BC

## 2019-01-02 ENCOUNTER — Encounter: Payer: Self-pay | Attending: Physical Medicine and Rehabilitation | Admitting: Physical Medicine and Rehabilitation

## 2019-01-02 ENCOUNTER — Other Ambulatory Visit: Payer: Self-pay

## 2019-01-02 ENCOUNTER — Encounter: Payer: Self-pay | Admitting: Physical Medicine and Rehabilitation

## 2019-01-02 VITALS — BP 151/95 | HR 120 | Temp 97.7°F | Ht 63.0 in | Wt 221.0 lb

## 2019-01-02 DIAGNOSIS — M546 Pain in thoracic spine: Secondary | ICD-10-CM

## 2019-01-02 DIAGNOSIS — G8929 Other chronic pain: Secondary | ICD-10-CM | POA: Insufficient documentation

## 2019-01-02 DIAGNOSIS — M5442 Lumbago with sciatica, left side: Secondary | ICD-10-CM | POA: Insufficient documentation

## 2019-01-02 DIAGNOSIS — Z6841 Body Mass Index (BMI) 40.0 and over, adult: Secondary | ICD-10-CM | POA: Insufficient documentation

## 2019-01-02 DIAGNOSIS — M7918 Myalgia, other site: Secondary | ICD-10-CM | POA: Insufficient documentation

## 2019-01-02 MED ORDER — DULOXETINE HCL 30 MG PO CPEP: 30.0000 mg | ORAL_CAPSULE | Freq: Every day | ORAL | 5 refills | Status: DC

## 2019-01-02 MED ORDER — CYCLOBENZAPRINE HCL 10 MG PO TABS: 10.0000 mg | ORAL_TABLET | Freq: Every evening | ORAL | 5 refills | Status: DC | PRN

## 2019-01-02 NOTE — Patient Instructions (Signed)
Patient is a 43 yr old female with hx of obesity on Phentermine and Chronic low back pain here for f/u.    1.  Duloxetine /Cymbalta 30 mg nightly x 1 week  Then 60 mg nightly- for nerve pain  1% of patients can have nausea with Duloxetine- call me if needs an anti-nausea medicine. Can also cause mild dry mouth/dry eyes and mild constipation. -TAKE EVERY DAY  2. Take Diclofenac if pain not improved with rest- as NEEDED  3. THERA-CANE- order from [employer] or online-  Hold PRESSURE x 2-4 minutes on each tight muscle-  - can also use tennis balls until gets theracane from [employer]- hold tennis ball between you and wall- push against wall to hold pressure.  4. Yoga- CHAIR YOGA to stretch- will look up specific exercises to help  5. Can also do trigger point/muscle tightness injections. Will wait  6. PCP needs to look at BP since on Phenteramine. At home 100-120/70s  7. Flexeril/Cyclobenzaprine for muscle relaxation- 10 mg as needed up to 1x/day- can make sleep   8. F/U in 6 weeks

## 2019-01-02 NOTE — Progress Notes (Signed)
Subjective:    Patient ID: Kristin [name], female    DOB: August 20, 1975, 43 y.o.   MRN: [id number]  HPI  Patient is a 43 yr old female with hx of obesity on Phentermine and Chronic low back pain here for f/u.  Doing the HEP was asked to do.  Stopped doing HEP 2 weeks ago because needed to "do other things". Usually doing on the floor most days doing the HEP.  After each meal, walks a little bit- sometimes can handle 20-30 minutes- doing it slowly.   Ordered Diclofenac, but didn't Duloxetine. Haven't taken Diclofenac- for pain-   Feels tired and burning- when rests, it goes away. Can point to 1 place on L mid thoracic paraspinals- tight and gets hard- then burnings and sits down to rest or lays down.  Also tight on chest with muscles and tight in shoulders/upper back. And can feel cracking/popping sounds.      Pain Inventory Average Pain 6 Pain Right Now 0 My pain is burning, dull and tingling  In the last 24 hours, has pain interfered with the following? General activity 5 Relation with others 0 Enjoyment of life 0 What TIME of day is your pain at its worst? evening Sleep (in general) Poor  Pain is worse with: some activites Pain improves with: rest and therapy/exercise Relief from Meds: n/a  Mobility walk without assistance ability to climb steps?  yes do you drive?  no use a wheelchair needs help with transfers transfers alone  Function not employed: date last employed .  Neuro/Psych numbness trouble walking  Prior Studies Any changes since last visit?  no  Physicians involved in your care Any changes since last visit?  no   Family History  Problem Relation Age of Onset  . Breast cancer Neg Hx    Social History   Socioeconomic History  . Marital status: Single    Spouse name: Not on file  . Number of children: 3  . Years of education: Not on file  . Highest education level: 6th grade  Occupational History  . Not on file  Tobacco Use    . Smoking status: Never Smoker  . Smokeless tobacco: Never Used  Substance and Sexual Activity  . Alcohol use: Yes    Comment: occasionally   . Drug use: No  . Sexual activity: Yes  Other Topics Concern  . Not on file  Social History Narrative  . Not on file   Social Determinants of Health   Financial Resource Strain:   . Difficulty of Paying Living Expenses: Not on file  Food Insecurity:   . Worried About Charity fundraiser in the Last Year: Not on file  . Ran Out of Food in the Last Year: Not on file  Transportation Needs: No Transportation Needs  . Lack of Transportation (Medical): No  . Lack of Transportation (Non-Medical): No  Physical Activity:   . Days of Exercise per Week: Not on file  . Minutes of Exercise per Session: Not on file  Stress:   . Feeling of Stress : Not on file  Social Connections:   . Frequency of Communication with Friends and Family: Not on file  . Frequency of Social Gatherings with Friends and Family: Not on file  . Attends Religious Services: Not on file  . Active Member of Clubs or Organizations: Not on file  . Attends Archivist Meetings: Not on file  . Marital Status: Not on file   Past  Surgical History:  Procedure Laterality Date  . etopic pregnancy     Past Medical History:  Diagnosis Date  . Ectopic pregnancy   . Nephrolithiasis    BP (!) 151/95   Pulse (!) 120   Temp 97.7 F (36.5 C)   Ht 5\' 3"  (1.6 m)   Wt 221 lb (100.2 kg)   SpO2 98%   BMI 39.15 kg/m   Opioid Risk Score:   Fall Risk Score:  `1  Depression screen PHQ 2/9  Depression screen [facility] 2/9 10/22/2018 09/10/2018 05/09/2018 03/14/2018 10/18/2017 02/22/2017 12/06/2016  Decreased Interest 0 0 0 0 0 0 0  Down, Depressed, Hopeless 0 0 2 1 0 0 0  PHQ - 2 Score 0 0 2 1 0 0 0  Altered sleeping 0 0 1 0 1 0 1  Tired, decreased energy 0 0 0 0 1 0 1  Change in appetite 0 0 0 0 0 0 0  Feeling bad or failure about yourself  0 0 0 0 0 0 0  Trouble concentrating 0 0  0 0 0 0 0  Moving slowly or fidgety/restless 0 0 0 0 0 0 0  Suicidal thoughts 0 0 0 0 0 0 0  PHQ-9 Score 0 0 3 1 2  0 2    Review of Systems  Constitutional: Negative.   HENT: Negative.   Eyes: Negative.   Respiratory: Negative.   Cardiovascular: Negative.   Endocrine: Negative.   Genitourinary: Negative.   Musculoskeletal: Positive for back pain and gait problem.  Skin: Negative.   Allergic/Immunologic: Negative.   Neurological: Positive for numbness.  Hematological: Negative.   Psychiatric/Behavioral: Negative.   All other systems reviewed and are negative.      Objective:   Physical Exam  BP 151/95 pulse 120- rushed to get here Awake, alert, appropriate, NAD Tight in [place] B/L, upper traps, levators and scalenes as well as rhomboids B/L      Assessment & Plan:   Patient is a 42 yr old female with hx of obesity on Phentermine and Chronic low back pain here for f/u.    1.  Duloxetine /Cymbalta 30 mg nightly x 1 week  Then 60 mg nightly- for nerve pain  1% of patients can have nausea with Duloxetine- call me if needs an anti-nausea medicine. Can also cause mild dry mouth/dry eyes and mild constipation. -TAKE EVERY DAY  2. Take Diclofenac if pain not improved with rest- as NEEDED  3. THERA-CANE- order from [employer] or online-  Hold PRESSURE x 2-4 minutes on each tight muscle-  - can also use tennis balls until gets theracane from [employer]- hold tennis ball between you and wall- push against wall to hold pressure.  4. Yoga- CHAIR YOGA to stretch- will look up specific exercises to help  5. Can also do trigger point/muscle tightness injections. Will wait  6. PCP needs to look at BP since on Phenteramine. At home 100-120/70s  7. Flexeril/Cyclobenzaprine for muscle relaxation- 10 mg as needed up to 1x/day- can make sleepy- take at night as needed for muscle tightness.   8. F/U in 6 weeks   I spent a total of 40 minutes on appointment- more than 30 minutes educating  pt on muscle tightness- and pain and how to treat

## 2019-01-25 ENCOUNTER — Telehealth: Payer: Self-pay

## 2019-01-25 NOTE — Telephone Encounter (Signed)
Patient daughter to inform that patient is only taking 1 tablet daily of Duloxetine because she states 2 is too strong.

## 2019-02-15 ENCOUNTER — Encounter: Payer: Self-pay | Admitting: Physical Medicine and Rehabilitation

## 2019-02-15 ENCOUNTER — Encounter: Payer: Self-pay | Attending: Physical Medicine and Rehabilitation | Admitting: Physical Medicine and Rehabilitation

## 2019-02-15 ENCOUNTER — Other Ambulatory Visit: Payer: Self-pay

## 2019-02-15 VITALS — BP 131/77 | HR 115 | Temp 97.5°F | Ht 63.0 in | Wt 219.0 lb

## 2019-02-15 DIAGNOSIS — M7918 Myalgia, other site: Secondary | ICD-10-CM

## 2019-02-15 DIAGNOSIS — Z6841 Body Mass Index (BMI) 40.0 and over, adult: Secondary | ICD-10-CM | POA: Insufficient documentation

## 2019-02-15 DIAGNOSIS — M5442 Lumbago with sciatica, left side: Secondary | ICD-10-CM | POA: Insufficient documentation

## 2019-02-15 DIAGNOSIS — M546 Pain in thoracic spine: Secondary | ICD-10-CM

## 2019-02-15 DIAGNOSIS — G8929 Other chronic pain: Secondary | ICD-10-CM | POA: Insufficient documentation

## 2019-02-15 MED ORDER — DULOXETINE HCL 30 MG PO CPEP: 30.0000 mg | ORAL_CAPSULE | Freq: Every day | ORAL | 5 refills | Status: DC

## 2019-02-15 MED ORDER — DICLOFENAC SODIUM 75 MG PO TBEC: 75.0000 mg | DELAYED_RELEASE_TABLET | Freq: Two times a day (BID) | ORAL | 5 refills | Status: DC | PRN

## 2019-02-15 NOTE — Progress Notes (Signed)
Subjective:    Patient ID: Kristin [name], female    DOB: 1975-10-19, 44 y.o.   MRN: [id number]  HPI  Patient is a 44 yr old female with hx of obesity on Phentermine and Chronic low back pain here for f/u.   Taking Duloxetine 30 mg daily- pain is well controlled on current meds.   Acts up sometimes when gets tired- actually doing better.   Diclofenac- ~ 3x/week when needs it.  Duloxetine only 1 pill at night- 30 mg QHS- if took 2 pills, would be sleepy the next day.   If can, sits on hard surface, hard chair, it's easier to get up from hard chair than soft chair.     Pain Inventory Average Pain 3 Pain Right Now 2 My pain is aching  In the last 24 hours, has pain interfered with the following? General activity 6 Relation with others 10 Enjoyment of life 10 What TIME of day is your pain at its worst? evening Sleep (in general) Fair  Pain is worse with: bending and standing Pain improves with: rest, therapy/exercise and medication Relief from Meds: 10  Mobility walk without assistance how many minutes can you walk? 30 ability to climb steps?  yes do you drive?  yes  Function not employed: date last employed .  Neuro/Psych No problems in this area  Prior Studies Any changes since last visit?  no  Physicians involved in your care Any changes since last visit?  no   Family History  Problem Relation Age of Onset  . Breast cancer Neg Hx    Social History   Socioeconomic History  . Marital status: Single    Spouse name: Not on file  . Number of children: 3  . Years of education: Not on file  . Highest education level: 6th grade  Occupational History  . Not on file  Tobacco Use  . Smoking status: Never Smoker  . Smokeless tobacco: Never Used  Substance and Sexual Activity  . Alcohol use: Yes    Comment: occasionally   . Drug use: No  . Sexual activity: Yes  Other Topics Concern  . Not on file  Social History Narrative  . Not on file    Social Determinants of Health   Financial Resource Strain:   . Difficulty of Paying Living Expenses: Not on file  Food Insecurity:   . Worried About Charity fundraiser in the Last Year: Not on file  . Ran Out of Food in the Last Year: Not on file  Transportation Needs:   . Lack of Transportation (Medical): Not on file  . Lack of Transportation (Non-Medical): Not on file  Physical Activity:   . Days of Exercise per Week: Not on file  . Minutes of Exercise per Session: Not on file  Stress:   . Feeling of Stress : Not on file  Social Connections:   . Frequency of Communication with Friends and Family: Not on file  . Frequency of Social Gatherings with Friends and Family: Not on file  . Attends Religious Services: Not on file  . Active Member of Clubs or Organizations: Not on file  . Attends Archivist Meetings: Not on file  . Marital Status: Not on file   Past Surgical History:  Procedure Laterality Date  . etopic pregnancy     Past Medical History:  Diagnosis Date  . Ectopic pregnancy   . Nephrolithiasis    BP 131/77   Pulse (!) 115  Temp (!) 97.5 F (36.4 C)   Ht 5\' 3"  (1.6 m)   Wt 219 lb (99.3 kg)   SpO2 98%   BMI 38.79 kg/m   Opioid Risk Score:   Fall Risk Score:  `1  Depression screen PHQ 2/9  Depression screen [facility] 2/9 10/22/2018 09/10/2018 05/09/2018 03/14/2018 10/18/2017 02/22/2017 12/06/2016  Decreased Interest 0 0 0 0 0 0 0  Down, Depressed, Hopeless 0 0 2 1 0 0 0  PHQ - 2 Score 0 0 2 1 0 0 0  Altered sleeping 0 0 1 0 1 0 1  Tired, decreased energy 0 0 0 0 1 0 1  Change in appetite 0 0 0 0 0 0 0  Feeling bad or failure about yourself  0 0 0 0 0 0 0  Trouble concentrating 0 0 0 0 0 0 0  Moving slowly or fidgety/restless 0 0 0 0 0 0 0  Suicidal thoughts 0 0 0 0 0 0 0  PHQ-9 Score 0 0 3 1 2  0 2    Review of Systems  Constitutional: Negative.   HENT: Negative.   Eyes: Negative.   Respiratory: Negative.   Cardiovascular: Negative.    Gastrointestinal: Negative.   Endocrine: Negative.   Genitourinary: Negative.   Musculoskeletal: Negative.   Skin: Negative.   Allergic/Immunologic: Negative.   Neurological: Negative.   Hematological: Negative.   Psychiatric/Behavioral: Negative.   All other systems reviewed and are negative.      Objective:   Physical Exam   Awake, alert, appropriate, accompanied by intrepretor, NAD Less TTP across low back in band      Assessment & Plan:   Patient is a 44 yr old female with hx of obesity on Phentermine and Chronic low back pain here for f/u.    1. Con't Diclfenac as needed and Duloxetine 30 mg nightly for pain. Refilled both medicines with 5 RFs.   2. F/U in 4 months and as needed if needed earlier

## 2019-02-15 NOTE — Patient Instructions (Signed)
Patient is a 44 yr old female with hx of obesity on Phentermine and Chronic low back pain here for f/u.    1. Con't Diclfenac as needed and Duloxetine 30 mg nightly for pain.   2. F/U in 4 months and as needed

## 2019-04-03 ENCOUNTER — Ambulatory Visit: Payer: Self-pay

## 2019-04-03 ENCOUNTER — Encounter: Payer: Self-pay | Admitting: Nurse Practitioner

## 2019-04-03 ENCOUNTER — Ambulatory Visit (HOSPITAL_BASED_OUTPATIENT_CLINIC_OR_DEPARTMENT_OTHER): Payer: Self-pay | Admitting: Nurse Practitioner

## 2019-04-03 ENCOUNTER — Other Ambulatory Visit: Payer: Self-pay

## 2019-04-03 VITALS — Wt 214.0 lb

## 2019-04-03 DIAGNOSIS — E669 Obesity, unspecified: Secondary | ICD-10-CM

## 2019-04-03 MED ORDER — PHENTERMINE HCL 37.5 MG PO TABS: 37.5000 mg | ORAL_TABLET | Freq: Every day | ORAL | 2 refills | Status: DC

## 2019-04-03 NOTE — Progress Notes (Signed)
Virtual Visit via Telephone Note Due to national recommendations of social distancing due to [place] 19, telehealth visit is felt to be most appropriate for this patient at this time.  I discussed the limitations, risks, security and privacy concerns of performing an evaluation and management service by telephone and the availability of in person appointments. I also discussed with the patient that there may be a patient responsible charge related to this service. The patient expressed understanding and agreed to proceed.    I connected with Kristin [name] on 04/03/19  at   2:50 PM EDT  EDT by telephone and verified that I am speaking with the correct person using two identifiers.   Consent I discussed the limitations, risks, security and privacy concerns of performing an evaluation and management service by telephone and the availability of in person appointments. I also discussed with the patient that there may be a patient responsible charge related to this service. The patient expressed understanding and agreed to proceed.   Location of Patient: Private  Residence    Location of Provider: [place] and [place] Office    Persons participating in Telemedicine visit: Kristin Rankins FNP-BC [place] ID # [id number]   History of Present Illness: Telemedicine visit for: Weight Loss  Last reported weight when she started phentermine in December was 220. She has only lost 6 lbs in 3 months which is well below what is expected. She states she has been walking 20-30 minutes per day but finds it difficult to walk due to her back pain.  She has not been counting her calories and I have given her dietary instructions today regarding carb intake.   Past Medical History:  Diagnosis Date  . Ectopic pregnancy   . Nephrolithiasis     Past Surgical History:  Procedure Laterality Date  . etopic pregnancy      Family History   Problem Relation Age of Onset  . Breast cancer Neg Hx     Social History   Socioeconomic History  . Marital status: Single    Spouse name: Not on file  . Number of children: 3  . Years of education: Not on file  . Highest education level: 6th grade  Occupational History  . Not on file  Tobacco Use  . Smoking status: Never Smoker  . Smokeless tobacco: Never Used  Substance and Sexual Activity  . Alcohol use: Yes    Comment: occasionally   . Drug use: No  . Sexual activity: Yes  Other Topics Concern  . Not on file  Social History Narrative  . Not on file   Social Determinants of Health   Financial Resource Strain:   . Difficulty of Paying Living Expenses:   Food Insecurity:   . Worried About Charity fundraiser in the Last Year:   . Arboriculturist in the Last Year:   Transportation Needs:   . Film/video editor (Medical):   Marland Kitchen Lack of Transportation (Non-Medical):   Physical Activity:   . Days of Exercise per Week:   . Minutes of Exercise per Session:   Stress:   . Feeling of Stress :   Social Connections:   . Frequency of Communication with Friends and Family:   . Frequency of Social Gatherings with Friends and Family:   . Attends Religious Services:   . Active Member of Clubs or Organizations:   . Attends Archivist Meetings:   .  Marital Status:      Observations/Objective: Awake, alert and oriented x 3   Review of Systems  Constitutional: Negative for fever, malaise/fatigue and weight loss.  HENT: Negative.  Negative for nosebleeds.   Eyes: Negative.  Negative for blurred vision, double vision and photophobia.  Respiratory: Negative.  Negative for cough and shortness of breath.   Cardiovascular: Negative.  Negative for chest pain, palpitations and leg swelling.  Gastrointestinal: Negative.  Negative for heartburn, nausea and vomiting.  Musculoskeletal: Negative.  Negative for myalgias.  Neurological: Negative.  Negative for dizziness,  focal weakness, seizures and headaches.  Psychiatric/Behavioral: Negative.  Negative for suicidal ideas.    Assessment and Plan: [name] was seen today for follow-up.  Diagnoses and all orders for this visit:  Obesity (BMI 30-39.9) -     phentermine (ADIPEX-P) 37.5 MG tablet; Take 1 tablet (37.5 mg total) by mouth daily before breakfast. Discussed diet and exercise for person with BMI >35. Instructed: You must burn more calories than you eat. Losing 5 percent of your body weight should be considered a success. In the longer term, losing more than 15 percent of your body weight and staying at this weight is an extremely good result. However, keep in mind that even losing 5 percent of your body weight leads to important health benefits, so try not to get discouraged if you're not able to lose more than this. Will recheck weight in 1 month.   Follow Up Instructions Return in about 4 weeks (around 05/01/2019).     I discussed the assessment and treatment plan with the patient. The patient was provided an opportunity to ask questions and all were answered. The patient agreed with the plan and demonstrated an understanding of the instructions.   The patient was advised to call back or seek an in-person evaluation if the symptoms worsen or if the condition fails to improve as anticipated.  I provided 15 minutes of non-face-to-face time during this encounter including median intraservice time, reviewing previous notes, labs, imaging, medications and explaining diagnosis and management.  Gildardo Pounds, FNP-BC

## 2019-04-10 ENCOUNTER — Ambulatory Visit: Payer: Self-pay | Attending: Nurse Practitioner

## 2019-04-10 ENCOUNTER — Other Ambulatory Visit: Payer: Self-pay

## 2019-04-10 DIAGNOSIS — Z0000 Encounter for general adult medical examination without abnormal findings: Secondary | ICD-10-CM

## 2019-04-10 DIAGNOSIS — E782 Mixed hyperlipidemia: Secondary | ICD-10-CM

## 2019-04-11 LAB — VITAMIN D 25 HYDROXY (VIT D DEFICIENCY, FRACTURES): Vit D, 25-Hydroxy: 32.4 ng/mL (ref 30.0–100.0)

## 2019-04-11 LAB — LIPID PANEL
Chol/HDL Ratio: 4.5 ratio — ABNORMAL HIGH (ref 0.0–4.4)
Cholesterol, Total: 213 mg/dL — ABNORMAL HIGH (ref 100–199)
HDL: 47 mg/dL (ref >39)
LDL Chol Calc (NIH): 152 mg/dL — ABNORMAL HIGH (ref 0–99)
Triglycerides: 79 mg/dL (ref 0–149)
VLDL Cholesterol Cal: 14 mg/dL (ref 5–40)

## 2019-04-11 LAB — CMP14+EGFR
ALT: 13 IU/L (ref 0–32)
AST: 18 IU/L (ref 0–40)
Albumin/Globulin Ratio: 1.5 (ref 1.2–2.2)
Albumin: 4.3 g/dL (ref 3.8–4.8)
Alkaline Phosphatase: 74 IU/L (ref 39–117)
BUN/Creatinine Ratio: 16 (ref 9–23)
BUN: 12 mg/dL (ref 6–24)
Bilirubin Total: 0.4 mg/dL (ref 0.0–1.2)
CO2: 23 mmol/L (ref 20–29)
Calcium: 9.4 mg/dL (ref 8.7–10.2)
Chloride: 103 mmol/L (ref 96–106)
Creatinine, Ser: 0.75 mg/dL (ref 0.57–1.00)
GFR calc Af Amer: 111 mL/min/{1.73_m2} (ref >59)
GFR calc non Af Amer: 97 mL/min/{1.73_m2} (ref >59)
Globulin, Total: 2.8 g/dL (ref 1.5–4.5)
Glucose: 96 mg/dL (ref 65–99)
Potassium: 4.2 mmol/L (ref 3.5–5.2)
Sodium: 139 mmol/L (ref 134–144)
Total Protein: 7.1 g/dL (ref 6.0–8.5)

## 2019-04-11 LAB — CBC
Hematocrit: 42.3 % (ref 34.0–46.6)
Hemoglobin: 14.1 g/dL (ref 11.1–15.9)
MCH: 30.5 pg (ref 26.6–33.0)
MCHC: 33.3 g/dL (ref 31.5–35.7)
MCV: 91 fL (ref 79–97)
Platelets: 240 10*3/uL (ref 150–450)
RBC: 4.63 x10E6/uL (ref 3.77–5.28)
RDW: 13.2 % (ref 11.7–15.4)
WBC: 9.4 10*3/uL (ref 3.4–10.8)

## 2019-04-17 ENCOUNTER — Other Ambulatory Visit: Payer: Self-pay

## 2019-04-17 ENCOUNTER — Ambulatory Visit: Payer: Self-pay | Attending: Nurse Practitioner | Admitting: Nurse Practitioner

## 2019-04-17 ENCOUNTER — Encounter: Payer: Self-pay | Admitting: Nurse Practitioner

## 2019-04-17 DIAGNOSIS — R42 Dizziness and giddiness: Secondary | ICD-10-CM

## 2019-04-17 NOTE — Progress Notes (Signed)
Virtual Visit via Telephone Note Due to national recommendations of social distancing due to [place] 19, telehealth visit is felt to be most appropriate for this patient at this time.  I discussed the limitations, risks, security and privacy concerns of performing an evaluation and management service by telephone and the availability of in person appointments. I also discussed with the patient that there may be a patient responsible charge related to this service. The patient expressed understanding and agreed to proceed.    I connected with Kristin [name] on 04/17/19  at  10:50 AM EDT  EDT by telephone and verified that I am speaking with the correct person using two identifiers.   Consent I discussed the limitations, risks, security and privacy concerns of performing an evaluation and management service by telephone and the availability of in person appointments. I also discussed with the patient that there may be a patient responsible charge related to this service. The patient expressed understanding and agreed to proceed.   Location of Patient: Private  Residence    Location of Provider: [place] and [place] participating in Telemedicine visit: [name] FNP-BC [place]  [place] [id number]   History of Present Illness: Telemedicine visit for: Dizziness Endorses one episode of dizziness which occurred yesterday. Aggravating factors: motion. Relieving factors: Rest. She has not taken phentermine for several days so not likely related to phentermine. She is not sure how long the dizziness lasted and it has not occurred since yesterday. Currently Denies chest pain, shortness of breath, palpitations, lightheadedness, dizziness, headaches, visual disturbances, nausea or vomiting.   Past Medical History:  Diagnosis Date  . Ectopic pregnancy   . Nephrolithiasis     Past Surgical History:  Procedure  Laterality Date  . etopic pregnancy      Family History  Problem Relation Age of Onset  . Breast cancer Neg Hx     Social History   Socioeconomic History  . Marital status: Single    Spouse name: Not on file  . Number of children: 3  . Years of education: Not on file  . Highest education level: 6th grade  Occupational History  . Not on file  Tobacco Use  . Smoking status: Never Smoker  . Smokeless tobacco: Never Used  Substance and Sexual Activity  . Alcohol use: Yes    Comment: occasionally   . Drug use: No  . Sexual activity: Yes  Other Topics Concern  . Not on file  Social History Narrative  . Not on file   Social Determinants of Health   Financial Resource Strain:   . Difficulty of Paying Living Expenses:   Food Insecurity:   . Worried About Charity fundraiser in the Last Year:   . Arboriculturist in the Last Year:   Transportation Needs:   . Film/video editor (Medical):   Marland Kitchen Lack of Transportation (Non-Medical):   Physical Activity:   . Days of Exercise per Week:   . Minutes of Exercise per Session:   Stress:   . Feeling of Stress :   Social Connections:   . Frequency of Communication with Friends and Family:   . Frequency of Social Gatherings with Friends and Family:   . Attends Religious Services:   . Active Member of Clubs or Organizations:   . Attends Archivist Meetings:   Marland Kitchen Marital Status:      Observations/Objective: Awake, alert  and oriented x 3   Review of Systems  Constitutional: Negative for fever, malaise/fatigue and weight loss.  HENT: Negative.  Negative for nosebleeds.   Eyes: Negative.  Negative for blurred vision, double vision and photophobia.  Respiratory: Negative.  Negative for cough and shortness of breath.   Cardiovascular: Negative.  Negative for chest pain, palpitations and leg swelling.  Gastrointestinal: Negative.  Negative for heartburn, nausea and vomiting.  Musculoskeletal: Negative.  Negative for  myalgias.  Neurological: Positive for dizziness. Negative for focal weakness, seizures and headaches.  Psychiatric/Behavioral: Negative for suicidal ideas. The patient is nervous/anxious.     Assessment and Plan: Kristin [name] was seen today for dizziness.  Diagnoses and all orders for this visit:  Dizziness Resolved   Follow Up Instructions Return in about 6 weeks (around 05/29/2019).     I discussed the assessment and treatment plan with the patient. The patient was provided an opportunity to ask questions and all were answered. The patient agreed with the plan and demonstrated an understanding of the instructions.   The patient was advised to call back or seek an in-person evaluation if the symptoms worsen or if the condition fails to improve as anticipated.  I provided 14 minutes of non-face-to-face time during this encounter including median intraservice time, reviewing previous notes, labs, imaging, medications and explaining diagnosis and management.  Gildardo Pounds, FNP-BC

## 2019-05-13 ENCOUNTER — Other Ambulatory Visit: Payer: Self-pay

## 2019-05-13 ENCOUNTER — Encounter: Payer: Self-pay | Admitting: Nurse Practitioner

## 2019-05-13 ENCOUNTER — Ambulatory Visit: Payer: Self-pay | Attending: Nurse Practitioner | Admitting: Nurse Practitioner

## 2019-05-13 DIAGNOSIS — E669 Obesity, unspecified: Secondary | ICD-10-CM

## 2019-05-13 MED ORDER — PHENTERMINE HCL 37.5 MG PO TABS: 37.5000 mg | ORAL_TABLET | Freq: Every day | ORAL | 2 refills | Status: DC

## 2019-05-13 NOTE — Progress Notes (Signed)
Virtual Visit via Telephone Note Due to national recommendations of social distancing due to [place] 19, telehealth visit is felt to be most appropriate for this patient at this time.  I discussed the limitations, risks, security and privacy concerns of performing an evaluation and management service by telephone and the availability of in person appointments. I also discussed with the patient that there may be a patient responsible charge related to this service. The patient expressed understanding and agreed to proceed.    I connected with Kristin [name] on 05/13/19  at   3:50 PM EDT  EDT by telephone and verified that I am speaking with the correct person using two identifiers.   Consent I discussed the limitations, risks, security and privacy concerns of performing an evaluation and management service by telephone and the availability of in person appointments. I also discussed with the patient that there may be a patient responsible charge related to this service. The patient expressed understanding and agreed to proceed.   Location of Patient: Private Residence   Location of Provider: [place] and [place] participating in Telemedicine visit: [name] FNP-BC [place]  [place] Interpreter ID# [place] [id number]   History of Present Illness: Telemedicine visit for: Weight Loss   Weight Loss She is also walking 40 minutes a day. Unsure of the distance. She is not power walking due to the pain in her hips and low back. She is working with PMR for her low back. She has lost a total of 7 lbs in 5 months. Will continue to take phentermine 37.5 mg. Will see her back in the office in 2 months. She will need to lose at least 5-7 lbs by then to continue medication.  Wt Readings from Last 3 Encounters:  04/03/19 214 lb (97.1 kg)  02/15/19 219 lb (99.3 kg)  01/02/19 221 lb (100.2 kg)    Past Medical History:  Diagnosis  Date  . Ectopic pregnancy   . Nephrolithiasis     Past Surgical History:  Procedure Laterality Date  . etopic pregnancy      Family History  Problem Relation Age of Onset  . Breast cancer Neg Hx     Social History   Socioeconomic History  . Marital status: Single    Spouse name: Not on file  . Number of children: 3  . Years of education: Not on file  . Highest education level: 6th grade  Occupational History  . Not on file  Tobacco Use  . Smoking status: Never Smoker  . Smokeless tobacco: Never Used  Substance and Sexual Activity  . Alcohol use: Yes    Comment: occasionally   . Drug use: No  . Sexual activity: Yes  Other Topics Concern  . Not on file  Social History Narrative  . Not on file   Social Determinants of Health   Financial Resource Strain:   . Difficulty of Paying Living Expenses:   Food Insecurity:   . Worried About Charity fundraiser in the Last Year:   . Arboriculturist in the Last Year:   Transportation Needs:   . Film/video editor (Medical):   Marland Kitchen Lack of Transportation (Non-Medical):   Physical Activity:   . Days of Exercise per Week:   . Minutes of Exercise per Session:   Stress:   . Feeling of Stress :   Social Connections:   . Frequency of Communication with  Friends and Family:   . Frequency of Social Gatherings with Friends and Family:   . Attends Religious Services:   . Active Member of Clubs or Organizations:   . Attends Archivist Meetings:   Marland Kitchen Marital Status:      Observations/Objective: Awake, alert and oriented x 3   Review of Systems  Constitutional: Negative for fever, malaise/fatigue and weight loss.  HENT: Negative.  Negative for nosebleeds.   Eyes: Negative.  Negative for blurred vision, double vision and photophobia.  Respiratory: Negative.  Negative for cough and shortness of breath.   Cardiovascular: Negative.  Negative for chest pain, palpitations and leg swelling.  Gastrointestinal: Negative.   Negative for heartburn, nausea and vomiting.  Musculoskeletal: Negative.  Negative for myalgias.  Neurological: Negative.  Negative for dizziness, focal weakness, seizures and headaches.  Psychiatric/Behavioral: Negative for suicidal ideas. The patient is nervous/anxious.     Assessment and Plan: Sherin was seen today for weight check.  Diagnoses and all orders for this visit:  Obesity (BMI 30-39.9) -     phentermine (ADIPEX-P) 37.5 MG tablet; Take 1 tablet (37.5 mg total) by mouth daily before breakfast. Discussed diet and exercise for person with BMI >30. Instructed: You must burn more calories than you eat. Losing 5 percent of your body weight should be considered a success. In the longer term, losing more than 15 percent of your body weight and staying at this weight is an extremely good result. However, keep in mind that even losing 5 percent of your body weight leads to important health benefits, so try not to get discouraged if you're not able to lose more than this. Will recheck weight in 2 months.   Follow Up Instructions Return in about 9 weeks (around 07/15/2019).     I discussed the assessment and treatment plan with the patient. The patient was provided an opportunity to ask questions and all were answered. The patient agreed with the plan and demonstrated an understanding of the instructions.   The patient was advised to call back or seek an in-person evaluation if the symptoms worsen or if the condition fails to improve as anticipated.  I provided 16 minutes of non-face-to-face time during this encounter including median intraservice time, reviewing previous notes, labs, imaging, medications and explaining diagnosis and management.  Gildardo Pounds, FNP-BC

## 2019-06-12 ENCOUNTER — Encounter: Payer: Self-pay | Attending: Physical Medicine and Rehabilitation | Admitting: Physical Medicine and Rehabilitation

## 2019-06-12 DIAGNOSIS — M546 Pain in thoracic spine: Secondary | ICD-10-CM | POA: Insufficient documentation

## 2019-06-12 DIAGNOSIS — G8929 Other chronic pain: Secondary | ICD-10-CM | POA: Insufficient documentation

## 2019-06-12 DIAGNOSIS — M7918 Myalgia, other site: Secondary | ICD-10-CM | POA: Insufficient documentation

## 2019-06-12 DIAGNOSIS — M545 Low back pain: Secondary | ICD-10-CM | POA: Insufficient documentation

## 2019-07-05 ENCOUNTER — Telehealth: Payer: Self-pay | Admitting: Nurse Practitioner

## 2019-07-05 NOTE — Telephone Encounter (Signed)
Patient called requesting a dental referral because she states she is having pain on one of the tooth. Patient has the OC. Please f/u

## 2019-07-10 ENCOUNTER — Other Ambulatory Visit: Payer: Self-pay

## 2019-07-10 ENCOUNTER — Encounter: Payer: Self-pay | Admitting: Physical Medicine and Rehabilitation

## 2019-07-10 ENCOUNTER — Encounter (HOSPITAL_BASED_OUTPATIENT_CLINIC_OR_DEPARTMENT_OTHER): Payer: Self-pay | Admitting: Physical Medicine and Rehabilitation

## 2019-07-10 VITALS — BP 132/87 | HR 108 | Temp 98.4°F | Ht 63.5 in | Wt 213.0 lb

## 2019-07-10 DIAGNOSIS — M545 Low back pain: Secondary | ICD-10-CM

## 2019-07-10 DIAGNOSIS — M546 Pain in thoracic spine: Secondary | ICD-10-CM

## 2019-07-10 DIAGNOSIS — G8929 Other chronic pain: Secondary | ICD-10-CM

## 2019-07-10 DIAGNOSIS — M7918 Myalgia, other site: Secondary | ICD-10-CM

## 2019-07-10 NOTE — Patient Instructions (Signed)
Patient is a 44 yr old female with hx of obesity on Phentermine and Chronic low back pain here forf/u.   1. Discussed "popping"-  And how is due to cartilage in joints. Discussed this is normal.   2. Doing well with pain meds currently-    3. Doesn't need refills of meds- has some left of both- can call of do Mychart to ask for more medications.  Or have pharmacy call in request for refills.    4. Suggest referral to Dr Hulan Saas- for as needed OMT  5. Would rather see Dr Tamala Julian for OMT (manipulation) than getting trigger point injections- doesn't like needles. Thinks would get better response with manipulation   6 F/U in 6 months or earlier as needed- recommend limit bending and stooping

## 2019-07-10 NOTE — Progress Notes (Signed)
Subjective:    Patient ID: Kristin [name], female    DOB: 1975/11/22, 44 y.o.   MRN: [id number]  HPI Patient is a 44 yr old female with hx of obesity on Phentermine and Chronic low back pain here forf/u.   Medicine working- On Diclofenac 75 mg BID prn Taking ~ 2x/week- for muscles/pain.   When hurting, goes and lays down and feels better.   Used to only hurt on L low back, but But when shoulders hurt, takes Diclofenac.   Both arms will sometimes pop- - arms also can be tired- massages it and it goes away.  Wondering why.  And Duloxetine- taking 30 mg- since tried 60 mg and made her sick, so con't 30 mg daily  Walks slowly every AM- 4000 steps every AM Feels pain when walks too fast or does too much.   Not working right now.  Prefers to work.    Pain Inventory Average Pain 5 Pain Right Now 0 My pain is constant and dull  In the last 24 hours, has pain interfered with the following? General activity 0 Relation with others 0 Enjoyment of life 0 What TIME of day is your pain at its worst? evening Sleep (in general) Poor  Pain is worse with: sitting, inactivity, standing and same position Pain improves with: rest and switching position and stretching Relief from Meds: 10  Mobility walk without assistance  Function not employed: date last employed .  Neuro/Psych No problems in this area  Prior Studies Any changes since last visit?  no  Physicians involved in your care Any changes since last visit?  no   Family History  Problem Relation Age of Onset  . Breast cancer Neg Hx    Social History   Socioeconomic History  . Marital status: Single    Spouse name: Not on file  . Number of children: 3  . Years of education: Not on file  . Highest education level: 6th grade  Occupational History  . Not on file  Tobacco Use  . Smoking status: Never Smoker  . Smokeless tobacco: Never Used  Vaping Use  . Vaping Use: Never used  Substance and  Sexual Activity  . Alcohol use: Yes    Comment: occasionally   . Drug use: No  . Sexual activity: Yes  Other Topics Concern  . Not on file  Social History Narrative  . Not on file   Social Determinants of Health   Financial Resource Strain:   . Difficulty of Paying Living Expenses:   Food Insecurity:   . Worried About Charity fundraiser in the Last Year:   . Arboriculturist in the Last Year:   Transportation Needs:   . Film/video editor (Medical):   Marland Kitchen Lack of Transportation (Non-Medical):   Physical Activity:   . Days of Exercise per Week:   . Minutes of Exercise per Session:   Stress:   . Feeling of Stress :   Social Connections:   . Frequency of Communication with Friends and Family:   . Frequency of Social Gatherings with Friends and Family:   . Attends Religious Services:   . Active Member of Clubs or Organizations:   . Attends Archivist Meetings:   Marland Kitchen Marital Status:    Past Surgical History:  Procedure Laterality Date  . etopic pregnancy     Past Medical History:  Diagnosis Date  . Ectopic pregnancy   . Nephrolithiasis  BP 132/87   Pulse (!) 108   Temp 98.4 F (36.9 C)   Ht 5' 3.5" (1.613 m)   Wt 213 lb (96.6 kg)   SpO2 98%   BMI 37.14 kg/m   Opioid Risk Score:   Fall Risk Score:  `1  Depression screen PHQ 2/9  Depression screen [facility] 2/9 07/10/2019 04/03/2019 10/22/2018 09/10/2018 05/09/2018 03/14/2018 10/18/2017  Decreased Interest 0 0 0 0 0 0 0  Down, Depressed, Hopeless 0 0 0 0 2 1 0  PHQ - 2 Score 0 0 0 0 2 1 0  Altered sleeping - 0 0 0 1 0 1  Tired, decreased energy - 0 0 0 0 0 1  Change in appetite - 0 0 0 0 0 0  Feeling bad or failure about yourself  - 0 0 0 0 0 0  Trouble concentrating - 0 0 0 0 0 0  Moving slowly or fidgety/restless - 0 0 0 0 0 0  Suicidal thoughts - 0 0 0 0 0 0  PHQ-9 Score - 0 0 0 3 1 2    Review of Systems  Constitutional: Negative.   HENT: Negative.   Eyes: Negative.   Respiratory: Negative.     Cardiovascular: Negative.   Gastrointestinal: Negative.   Endocrine: Negative.   Genitourinary: Negative.   Musculoskeletal: Positive for back pain.  Skin: Negative.   Allergic/Immunologic: Negative.   Neurological: Negative.   Hematological: Negative.   Psychiatric/Behavioral: Negative.   All other systems reviewed and are negative.      Objective:   Physical Exam  Awake, alert, appropriate, sitting up on table, NAD No TTP over L low back Has a  Few trigger points in lumbar paraspinals on L>R Also is tight in B/L pecs, upper traps, levators and scalenes.       Assessment & Plan:  Patient is a 44 yr old female with hx of obesity on Phentermine and Chronic low back pain here forf/u.   1. Discussed "popping"-  And how is due to cartilage in joints. Discussed this is normal.   2. Doing well with pain meds currently-    3. Doesn't need refills of meds- has some left of both- can call of do Mychart to ask for more medications.  Or have pharmacy call in request for refills.    4. Suggest referral to Dr Hulan Saas- for as needed OMT  5. Would rather see Dr Tamala Julian for OMT than getting trigger point injections- doesn't like needles.    6 F/U in 6 months or earlier as needed- recommend limit bending and stooping.    I spent a total of 25 minutes on visit today- as detailed above.

## 2019-07-11 ENCOUNTER — Other Ambulatory Visit: Payer: Self-pay | Admitting: Nurse Practitioner

## 2019-07-11 DIAGNOSIS — K0889 Other specified disorders of teeth and supporting structures: Secondary | ICD-10-CM

## 2019-07-29 ENCOUNTER — Telehealth: Payer: Self-pay | Admitting: Nurse Practitioner

## 2019-07-29 NOTE — Telephone Encounter (Signed)
Please advise   Copied from [place] [id number]. Topic: General - Other >> Jul 29, 2019  3:08 PM Hinda Lenis D wrote: Reason for CRM: PT stop taking this medication / phentermine (ADIPEX-P) 37.5 MG tablet [[id number]]

## 2019-08-02 NOTE — Telephone Encounter (Signed)
Will route to PCP to inform patient stopped taking Phentermine.

## 2019-08-04 NOTE — Telephone Encounter (Signed)
Noted  

## 2019-08-09 ENCOUNTER — Ambulatory Visit: Payer: No Typology Code available for payment source | Admitting: Nurse Practitioner

## 2019-08-15 ENCOUNTER — Telehealth: Payer: Self-pay | Admitting: Nurse Practitioner

## 2019-08-15 NOTE — Telephone Encounter (Signed)
Please advise.   Copied from [place] [id number]. Topic: General - Call Back - No Documentation >> Aug 15, 2019 10:53 AM Erick Blinks wrote: Pt called requesting a call back from [facility] regarding her orange card/questions. She was referred to a dentist but her orange card was not accepted. Please advise  Best contact: [phone]

## 2019-08-15 NOTE — Telephone Encounter (Signed)
Pt was informed that she was not add in the [facility] program, that I will update her information and she was approve for the [facility] program

## 2019-09-23 ENCOUNTER — Other Ambulatory Visit: Payer: Self-pay

## 2019-09-23 ENCOUNTER — Encounter: Payer: Self-pay | Admitting: Nurse Practitioner

## 2019-09-23 ENCOUNTER — Ambulatory Visit: Payer: Self-pay | Attending: Nurse Practitioner | Admitting: Nurse Practitioner

## 2019-09-23 VITALS — BP 130/84 | HR 92 | Temp 97.7°F | Ht 63.0 in | Wt 222.0 lb

## 2019-09-23 DIAGNOSIS — E669 Obesity, unspecified: Secondary | ICD-10-CM

## 2019-09-23 DIAGNOSIS — E785 Hyperlipidemia, unspecified: Secondary | ICD-10-CM

## 2019-09-23 DIAGNOSIS — Z130 Encounter for screening for diseases of the blood and blood-forming organs and certain disorders involving the immune mechanism: Secondary | ICD-10-CM

## 2019-09-23 MED ORDER — PHENTERMINE HCL 37.5 MG PO TABS: 37.5000 mg | ORAL_TABLET | Freq: Every day | ORAL | 2 refills | Status: DC

## 2019-09-23 NOTE — Progress Notes (Signed)
Assessment & Plan:  Kristin [name] was seen today for blood pressure check.  Diagnoses and all orders for this visit:  Obesity (BMI 30-39.9) -     Hemoglobin A1c -     Basic metabolic panel -     Lipid panel -     phentermine (ADIPEX-P) 37.5 MG tablet; Take 1 tablet (37.5 mg total) by mouth daily before breakfast. Discussed diet and exercise for person with BMI >39. Instructed: You must burn more calories than you eat. Losing 5 percent of your body weight should be considered a success. In the longer term, losing more than 15 percent of your body weight and staying at this weight is an extremely good result. However, keep in mind that even losing 5 percent of your body weight leads to important health benefits, so try not to get discouraged if you're not able to lose more than this. Will recheck weight in 3 months.   Dyslipidemia, goal LDL below 100 -     Lipid panel INSTRUCTIONS: Work on a low fat, heart healthy diet and participate in regular aerobic exercise program by working out at least 150 minutes per week; 5 days a week-30 minutes per day. Avoid red meat/beef/steak,  fried foods. junk foods, sodas, sugary drinks, unhealthy snacking, alcohol and smoking.  Drink at least 80 oz of water per day and monitor your carbohydrate intake daily.    Screening for deficiency anemia -     CBC    Patient has been counseled on age-appropriate routine health concerns for screening and prevention. These are reviewed and up-to-date. Referrals have been placed accordingly. Immunizations are up-to-date or declined.    Subjective:   Chief Complaint  Patient presents with  . Blood Pressure Check    Pt. is here for blood pressure check.    HPI Kristin [name] 44 y.o. female presents to office today for follow up.  has a past medical history of Ectopic pregnancy and Nephrolithiasis. VRI was used to communicate directly with patient for the entire encounter including providing detailed patient  instructions.   She was taking phentermine in the past however she stopped as it was causing insomnia.  I have instructed her that this is a common side effect of phentermine. She has also gained 10 lbs since stopping it. She has back pain which prohibits her from working out as much sometimes.   Review of Systems  Constitutional: Negative for fever, malaise/fatigue and weight loss.  HENT: Negative.  Negative for nosebleeds.   Eyes: Negative.  Negative for blurred vision, double vision and photophobia.  Respiratory: Negative.  Negative for cough and shortness of breath.   Cardiovascular: Negative.  Negative for chest pain, palpitations and leg swelling.  Gastrointestinal: Negative.  Negative for heartburn, nausea and vomiting.  Musculoskeletal: Negative.  Negative for myalgias.  Neurological: Negative.  Negative for dizziness, focal weakness, seizures and headaches.  Psychiatric/Behavioral: Negative.  Negative for suicidal ideas.    Past Medical History:  Diagnosis Date  . Ectopic pregnancy   . Nephrolithiasis     Past Surgical History:  Procedure Laterality Date  . etopic pregnancy      Family History  Problem Relation Age of Onset  . Breast cancer Neg Hx     Social History Reviewed with no changes to be made today.   Outpatient Medications Prior to Visit  Medication Sig Dispense Refill  . cyclobenzaprine (FLEXERIL) 10 MG tablet Take 1 tablet (10 mg total) by mouth at bedtime as needed  for muscle spasms. 30 tablet 5  . diclofenac (VOLTAREN) 75 MG EC tablet Take 1 tablet (75 mg total) by mouth 2 (two) times daily as needed. 60 tablet 5  . DULoxetine (CYMBALTA) 30 MG capsule Take 1 capsule (30 mg total) by mouth at bedtime. X 1 week then 60 mg (2 capsules) nightly- for back and nerve pain- take daily 30 capsule 5  . Multiple Vitamins-Minerals (MULTIVITAMIN ADULT PO) Take 1 tablet by mouth daily.     . phentermine (ADIPEX-P) 37.5 MG tablet Take 1 tablet (37.5 mg total) by mouth  daily before breakfast. (Patient not taking: Reported on 09/23/2019) 30 tablet 2   No facility-administered medications prior to visit.    Allergies  Allergen Reactions  . Wellbutrin [Bupropion] Rash       Objective:    BP 130/84 (BP Location: Left Arm, Patient Position: Sitting, Cuff Size: Normal)   Pulse 92   Temp 97.7 F (36.5 C) (Temporal)   Ht 5\' 3"  (1.6 m)   Wt 222 lb (100.7 kg)   SpO2 100%   BMI 39.33 kg/m  Wt Readings from Last 3 Encounters:  09/23/19 222 lb (100.7 kg)  07/10/19 213 lb (96.6 kg)  04/03/19 214 lb (97.1 kg)    Physical Exam Vitals and nursing note reviewed.  Constitutional:      Appearance: She is well-developed.  HENT:     Head: Normocephalic and atraumatic.  Cardiovascular:     Rate and Rhythm: Normal rate and regular rhythm.     Heart sounds: Normal heart sounds. No murmur heard.  No friction rub. No gallop.   Pulmonary:     Effort: Pulmonary effort is normal. No tachypnea or respiratory distress.     Breath sounds: Normal breath sounds. No decreased breath sounds, wheezing, rhonchi or rales.  Chest:     Chest wall: No tenderness.  Abdominal:     General: Bowel sounds are normal.     Palpations: Abdomen is soft.  Musculoskeletal:        General: Normal range of motion.     Cervical back: Normal range of motion.  Skin:    General: Skin is warm and dry.  Neurological:     Mental Status: She is alert and oriented to person, place, and time.     Coordination: Coordination normal.  Psychiatric:        Behavior: Behavior normal. Behavior is cooperative.        Thought Content: Thought content normal.        Judgment: Judgment normal.          Patient has been counseled extensively about nutrition and exercise as well as the importance of adherence with medications and regular follow-up. The patient was given clear instructions to go to ER or return to medical center if symptoms don't improve, worsen or new problems develop. The  patient verbalized understanding.   Follow-up: Return in about 8 weeks (around 11/18/2019) for weight check/bp check.   Gildardo Pounds, FNP-BC [facility] and [place], [place]   09/23/2019, 4:14 PM

## 2019-09-24 ENCOUNTER — Other Ambulatory Visit: Payer: Self-pay

## 2019-09-24 DIAGNOSIS — Z1231 Encounter for screening mammogram for malignant neoplasm of breast: Secondary | ICD-10-CM

## 2019-09-24 LAB — BASIC METABOLIC PANEL
BUN/Creatinine Ratio: 27 — ABNORMAL HIGH (ref 9–23)
BUN: 17 mg/dL (ref 6–24)
CO2: 25 mmol/L (ref 20–29)
Calcium: 9.9 mg/dL (ref 8.7–10.2)
Chloride: 103 mmol/L (ref 96–106)
Creatinine, Ser: 0.63 mg/dL (ref 0.57–1.00)
GFR calc Af Amer: 125 mL/min/{1.73_m2} (ref >59)
GFR calc non Af Amer: 109 mL/min/{1.73_m2} (ref >59)
Glucose: 88 mg/dL (ref 65–99)
Potassium: 4.8 mmol/L (ref 3.5–5.2)
Sodium: 139 mmol/L (ref 134–144)

## 2019-09-24 LAB — LIPID PANEL
Chol/HDL Ratio: 4.8 ratio — ABNORMAL HIGH (ref 0.0–4.4)
Cholesterol, Total: 239 mg/dL — ABNORMAL HIGH (ref 100–199)
HDL: 50 mg/dL (ref >39)
LDL Chol Calc (NIH): 163 mg/dL — ABNORMAL HIGH (ref 0–99)
Triglycerides: 145 mg/dL (ref 0–149)
VLDL Cholesterol Cal: 26 mg/dL (ref 5–40)

## 2019-09-24 LAB — CBC
Hematocrit: 41.5 % (ref 34.0–46.6)
Hemoglobin: 13.8 g/dL (ref 11.1–15.9)
MCH: 29.9 pg (ref 26.6–33.0)
MCHC: 33.3 g/dL (ref 31.5–35.7)
MCV: 90 fL (ref 79–97)
Platelets: 228 10*3/uL (ref 150–450)
RBC: 4.62 x10E6/uL (ref 3.77–5.28)
RDW: 13.2 % (ref 11.7–15.4)
WBC: 8.6 10*3/uL (ref 3.4–10.8)

## 2019-09-24 LAB — HEMOGLOBIN A1C
Est. average glucose Bld gHb Est-mCnc: 126 mg/dL
Hgb A1c MFr Bld: 6 % — ABNORMAL HIGH (ref 4.8–5.6)

## 2019-09-26 ENCOUNTER — Other Ambulatory Visit: Payer: Self-pay | Admitting: Obstetrics and Gynecology

## 2019-09-26 DIAGNOSIS — Z1231 Encounter for screening mammogram for malignant neoplasm of breast: Secondary | ICD-10-CM

## 2019-10-16 ENCOUNTER — Ambulatory Visit
Admission: RE | Admit: 2019-10-16 | Discharge: 2019-10-16 | Disposition: A | Discharge status: Home / Self Care | Payer: No Typology Code available for payment source | Source: Ambulatory Visit | Attending: Obstetrics and Gynecology | Admitting: Obstetrics and Gynecology

## 2019-10-16 ENCOUNTER — Other Ambulatory Visit: Payer: Self-pay

## 2019-11-20 ENCOUNTER — Other Ambulatory Visit: Payer: Self-pay

## 2019-11-20 ENCOUNTER — Ambulatory Visit: Payer: No Typology Code available for payment source | Attending: Nurse Practitioner | Admitting: Nurse Practitioner

## 2019-11-20 ENCOUNTER — Encounter: Payer: Self-pay | Admitting: Nurse Practitioner

## 2019-11-20 VITALS — BP 128/81 | HR 101 | Temp 97.7°F | Ht 63.0 in | Wt 217.0 lb

## 2019-11-20 DIAGNOSIS — E669 Obesity, unspecified: Secondary | ICD-10-CM

## 2019-11-20 DIAGNOSIS — R7303 Prediabetes: Secondary | ICD-10-CM

## 2019-11-20 DIAGNOSIS — Z23 Encounter for immunization: Secondary | ICD-10-CM

## 2019-11-20 LAB — GLUCOSE, POCT (MANUAL RESULT ENTRY): POC Glucose: 116 mg/dl — AB (ref 70–99)

## 2019-11-20 NOTE — Progress Notes (Signed)
Assessment & Plan:  Kristin [name] was seen today for weight check.  Diagnoses and all orders for this visit:  Prediabetes -     Glucose (CBG)  Obesity (BMI 30-39.9) Discussed diet and exercise for person with BMI >38. Instructed: You must burn more calories than you eat. Losing 5 percent of your body weight should be considered a success. In the longer term, losing more than 15 percent of your body weight and staying at this weight is an extremely good result. However, keep in mind that even losing 5 percent of your body weight leads to important health benefits, so try not to get discouraged if you're not able to lose more than this. Will recheck weight in 3 months  . Need for immunization against influenza -     Flu Vaccine QUAD 36+ mos IM    Patient has been counseled on age-appropriate routine health concerns for screening and prevention. These are reviewed and up-to-date. Referrals have been placed accordingly. Immunizations are up-to-date or declined.    Subjective:   Chief Complaint  Patient presents with  . Weight Check    Pt. is here for weight check and blood pressure check.    HPI Kristin [name] 44 y.o. female presents to office today for weight check. VRI was used to communicate directly with patient for the entire encounter including providing detailed patient instructions.   Obesity Will need to stop phentermine. She is experiencing tinnitus, tachycardia and feeling heart racing at night. She will continue with exercise and dietary modifications.   Review of Systems  Constitutional: Negative for fever, malaise/fatigue and weight loss.  HENT: Negative.  Negative for nosebleeds.   Eyes: Negative.  Negative for blurred vision, double vision and photophobia.  Respiratory: Negative.  Negative for cough and shortness of breath.   Cardiovascular: Negative.  Negative for chest pain, palpitations and leg swelling.  Gastrointestinal: Negative.  Negative for heartburn,  nausea and vomiting.  Musculoskeletal: Negative.  Negative for myalgias.  Neurological: Negative.  Negative for dizziness, focal weakness, seizures and headaches.  Psychiatric/Behavioral: Negative.  Negative for suicidal ideas.    Past Medical History:  Diagnosis Date  . Ectopic pregnancy   . Nephrolithiasis     Past Surgical History:  Procedure Laterality Date  . etopic pregnancy      Family History  Problem Relation Age of Onset  . Breast cancer Neg Hx     Social History Reviewed with no changes to be made today.   Outpatient Medications Prior to Visit  Medication Sig Dispense Refill  . cyclobenzaprine (FLEXERIL) 10 MG tablet Take 1 tablet (10 mg total) by mouth at bedtime as needed for muscle spasms. 30 tablet 5  . diclofenac (VOLTAREN) 75 MG EC tablet Take 1 tablet (75 mg total) by mouth 2 (two) times daily as needed. 60 tablet 5  . DULoxetine (CYMBALTA) 30 MG capsule Take 1 capsule (30 mg total) by mouth at bedtime. X 1 week then 60 mg (2 capsules) nightly- for back and nerve pain- take daily 30 capsule 5  . Multiple Vitamins-Minerals (MULTIVITAMIN ADULT PO) Take 1 tablet by mouth daily.     . phentermine (ADIPEX-P) 37.5 MG tablet Take 1 tablet (37.5 mg total) by mouth daily before breakfast. 30 tablet 2   No facility-administered medications prior to visit.    Allergies  Allergen Reactions  . Wellbutrin [Bupropion] Rash       Objective:    BP 128/81 (BP Location: Left Arm, Patient Position: Sitting,  Cuff Size: Large)   Pulse (!) 101   Temp 97.7 F (36.5 C) (Temporal)   Ht 5\' 3"  (1.6 m)   Wt 217 lb (98.4 kg)   LMP 11/19/2019   SpO2 100%   BMI 38.44 kg/m  Wt Readings from Last 3 Encounters:  11/20/19 217 lb (98.4 kg)  09/23/19 222 lb (100.7 kg)  07/10/19 213 lb (96.6 kg)    Physical Exam Vitals and nursing note reviewed.  Constitutional:      Appearance: She is well-developed.  HENT:     Head: Normocephalic and atraumatic.  Cardiovascular:      Rate and Rhythm: Regular rhythm. Tachycardia present.     Heart sounds: Normal heart sounds. No murmur heard.  No friction rub. No gallop.   Pulmonary:     Effort: Pulmonary effort is normal. No tachypnea or respiratory distress.     Breath sounds: Normal breath sounds. No decreased breath sounds, wheezing, rhonchi or rales.  Chest:     Chest wall: No tenderness.  Abdominal:     General: Bowel sounds are normal.     Palpations: Abdomen is soft.  Musculoskeletal:        General: Normal range of motion.     Cervical back: Normal range of motion.  Skin:    General: Skin is warm and dry.  Neurological:     Mental Status: She is alert and oriented to person, place, and time.     Coordination: Coordination normal.  Psychiatric:        Behavior: Behavior normal. Behavior is cooperative.        Thought Content: Thought content normal.        Judgment: Judgment normal.          Patient has been counseled extensively about nutrition and exercise as well as the importance of adherence with medications and regular follow-up. The patient was given clear instructions to go to ER or return to medical center if symptoms don't improve, worsen or new problems develop. The patient verbalized understanding.   Follow-up: Return in about 3 months (around 02/20/2020) for weight check off phentermine.   Gildardo Pounds, FNP-BC [facility] and [facility], [place]   11/20/2019, 11:53 AM

## 2019-11-27 ENCOUNTER — Ambulatory Visit: Payer: Self-pay | Attending: Nurse Practitioner

## 2019-11-27 ENCOUNTER — Other Ambulatory Visit: Payer: Self-pay

## 2019-12-26 ENCOUNTER — Other Ambulatory Visit: Payer: Self-pay | Admitting: Physical Medicine and Rehabilitation

## 2020-01-08 ENCOUNTER — Encounter: Payer: Self-pay | Admitting: Physical Medicine and Rehabilitation

## 2020-01-08 ENCOUNTER — Encounter
Payer: No Typology Code available for payment source | Attending: Physical Medicine and Rehabilitation | Admitting: Physical Medicine and Rehabilitation

## 2020-01-08 ENCOUNTER — Other Ambulatory Visit: Payer: Self-pay

## 2020-01-08 VITALS — BP 122/83 | HR 91 | Temp 98.0°F | Ht 63.0 in | Wt 229.4 lb

## 2020-01-08 DIAGNOSIS — M546 Pain in thoracic spine: Secondary | ICD-10-CM | POA: Insufficient documentation

## 2020-01-08 DIAGNOSIS — M7918 Myalgia, other site: Secondary | ICD-10-CM | POA: Insufficient documentation

## 2020-01-08 DIAGNOSIS — G8929 Other chronic pain: Secondary | ICD-10-CM | POA: Insufficient documentation

## 2020-01-08 MED ORDER — DICLOFENAC SODIUM 75 MG PO TBEC: 75.0000 mg | DELAYED_RELEASE_TABLET | Freq: Two times a day (BID) | ORAL | 5 refills | Status: DC | PRN

## 2020-01-08 MED ORDER — DULOXETINE HCL 30 MG PO CPEP: 30.0000 mg | ORAL_CAPSULE | Freq: Every day | ORAL | 11 refills | Status: DC

## 2020-01-08 NOTE — Patient Instructions (Signed)
  Patient is a 44 yr old female with hx of obesity on Phentermine and Chronic thoracic back pain here forf/u.  1. Can see Dr Antoine Primas- referral to Dr Antoine Primas- who does OMT/manipulation because he's a D.O.  I think will help her back pain.   2. She said if not effective, will try Trigger point injections- she told me for this first time, she's willing to try it.   3. Con't  Cymbalta 30 mg daily for nerve pain and Diclofenac 75 mg 2x/day for back pain as needed- sent in minimum 5 refills.   4. F/U 2 months- if Dr Katrinka Blazing doesn't work, will do injections.

## 2020-01-08 NOTE — Progress Notes (Signed)
Subjective:    Patient ID: Kristin [name], female    DOB: Jun 19, 1975, 44 y.o.   MRN: [id number]  HPI   Patient is a 44 yr old female with hx of obesity on Phentermine and Chronic low back pain here forf/u.   Things going really well-  Didn't see Dr Katrinka Blazing-  Didn't think could afford it.   When gets the pain- pain is sudden- is 10/10.  Wakes up well- because took pills at night- as continues during the day, around 7-8 pm, feels tired, pain in shoulders and back.   No average- sometimes 1-4x/week- just comes on suddenly.   Felt inflames/ around Low back, around to the front on Right side.   Gained >10 lbs. Since 6 months ago.  Thinks contributing to pain.     Pain Inventory Average Pain 0 Pain Right Now 0 My pain is na  In the last 24 hours, has pain interfered with the following? General activity 1 Relation with others 0 Enjoyment of life 0 What TIME of day is your pain at its worst? daytime Sleep (in general) Good  Pain is worse with: walking and some activites Pain improves with: rest and medication Relief from Meds: 10  Family History  Problem Relation Age of Onset  . Breast cancer Neg Hx    Social History   Socioeconomic History  . Marital status: Single    Spouse name: Not on file  . Number of children: 3  . Years of education: Not on file  . Highest education level: 6th grade  Occupational History  . Not on file  Tobacco Use  . Smoking status: Never Smoker  . Smokeless tobacco: Never Used  Vaping Use  . Vaping Use: Never used  Substance and Sexual Activity  . Alcohol use: Yes    Comment: occasionally   . Drug use: No  . Sexual activity: Yes  Other Topics Concern  . Not on file  Social History Narrative  . Not on file   Social Determinants of Health   Financial Resource Strain: Not on file  Food Insecurity: Not on file  Transportation Needs: Not on file  Physical Activity: Not on file  Stress: Not on file  Social  Connections: Not on file   Past Surgical History:  Procedure Laterality Date  . etopic pregnancy     Past Surgical History:  Procedure Laterality Date  . etopic pregnancy     Past Medical History:  Diagnosis Date  . Ectopic pregnancy   . Nephrolithiasis    BP 122/83   Pulse 91   Temp 98 F (36.7 C)   Ht 5\' 3"  (1.6 m)   Wt 229 lb 6.4 oz (104.1 kg)   SpO2 98%   BMI 40.64 kg/m   Opioid Risk Score:   Fall Risk Score:  `1  Depression screen PHQ 2/9  Depression screen [facility] 2/9 09/23/2019 07/10/2019 04/03/2019 10/22/2018 09/10/2018 05/09/2018 03/14/2018  Decreased Interest 0 0 0 0 0 0 0  Down, Depressed, Hopeless 0 0 0 0 0 2 1  PHQ - 2 Score 0 0 0 0 0 2 1  Altered sleeping 0 - 0 0 0 1 0  Tired, decreased energy 0 - 0 0 0 0 0  Change in appetite 0 - 0 0 0 0 0  Feeling bad or failure about yourself  0 - 0 0 0 0 0  Trouble concentrating 0 - 0 0 0 0 0  Moving slowly or fidgety/restless  0 - 0 0 0 0 0  Suicidal thoughts 0 - 0 0 0 0 0  PHQ-9 Score 0 - 0 0 0 3 1  Some recent data might be hidden     Review of Systems  All other systems reviewed and are negative.      Objective:   Physical Exam  Awake, alert, appropriate, sitting on table, interpretor in room, NAD Still has tight upper traps B/L, rhomboids B/L, and thoracic paraspinals- L>R today.      Assessment & Plan:   Patient is a 44 yr old female with hx of obesity on Phentermine and Chronic thoracic back pain here forf/u.  1. Can see Dr Antoine Primas- referral to Dr Antoine Primas- who does OMT/manipulation because he's a D.O.  I think will help her back pain.   2. She said if not effective, will try Trigger point injections- she told me for this first time, she's willing to try it.   3. Con't  Cymbalta 30 mg daily for nerve pain and Diclofenac 75 mg 2x/day for back pain as needed- sent in minimum 5 refills.   4. F/U 2 months- if Dr Katrinka Blazing doesn't work, will do injections.   I spent a total of 25 minutes on visit-  as detailed above.

## 2020-02-17 NOTE — Progress Notes (Signed)
[place] [address] [place] Phone: [phone] Subjective:   I Kandace Blitz am serving as a Education administrator for Dr. Hulan Saas.  This visit occurred during the SARS-CoV-2 public health emergency.  Safety protocols were in place, including screening questions prior to the visit, additional usage of staff PPE, and extensive cleaning of exam room while observing appropriate contact time as indicated for disinfecting solutions.   I'm seeing this patient by the request  of:  Gildardo Pounds, NP  CC: Back and neck pain  Patient is Spanish-speaking and is accompanied with a interpreter  [id number]  Kristin [name] is a 45 y.o. female coming in with complaint of back pain. Patient states her neck feels tired. Left scapular pain that radiates to the anterior ribs. History of sciatica. Ribs bother her the most. States her muscles feel tired. States that her joints pop with stretching. Posture may be contributing. The pain is always there.   Onset- Chronic  Location - neck  Duration- throughout the day as she is doing house chores  Character- sore  Aggravating factors- sitting for a long period of time, ADLs  Therapies tried- muscle relaxer as needed (2 times a week), rest, stretching  Severity- 2 or 3/10 at its worse   Patient has seen PMR.  Is on 30 mg of Cymbalta.  Referred to me to see if osteopathic manipulation would be beneficial. Patient previously used to see a chiropractor as well and he did get some benefit from that many years ago.   Xray lumbar spine 10/08/2018 IMPRESSION: Four non-rib-bearing lumbar type vertebral bodies. Moderate disc space narrowing at L4-S1. Other disc spaces appear unremarkable. 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 Other facets appear unremarkable. No fracture or spondylolisthesis.  Past Medical History:  Diagnosis Date  . Ectopic  pregnancy   . Nephrolithiasis    Past Surgical History:  Procedure Laterality Date  . etopic pregnancy     Social History   Socioeconomic History  . Marital status: Single    Spouse name: Not on file  . Number of children: 3  . Years of education: Not on file  . Highest education level: 6th grade  Occupational History  . Not on file  Tobacco Use  . Smoking status: Never Smoker  . Smokeless tobacco: Never Used  Vaping Use  . Vaping Use: Never used  Substance and Sexual Activity  . Alcohol use: Yes    Comment: occasionally   . Drug use: No  . Sexual activity: Yes  Other Topics Concern  . Not on file  Social History Narrative  . Not on file   Social Determinants of Health   Financial Resource Strain: Not on file  Food Insecurity: Not on file  Transportation Needs: Not on file  Physical Activity: Not on file  Stress: Not on file  Social Connections: Not on file   Allergies  Allergen Reactions  . Wellbutrin [Bupropion] Rash   Family History  Problem Relation Age of Onset  . Breast cancer Neg Hx        Current Outpatient Medications (Analgesics):  .  diclofenac (VOLTAREN) 75 MG EC tablet, Take 1 tablet (75 mg total) by mouth 2 (two) times daily as needed. For back pain   Current Outpatient Medications (Other):  .  cyclobenzaprine (FLEXERIL) 10 MG tablet, Take 1 tablet (10 mg total) by mouth at bedtime as needed for muscle  spasms. .  DULoxetine (CYMBALTA) 30 MG capsule, Take 1 capsule (30 mg total) by mouth at bedtime. For nerve pain .  Multiple Vitamins-Minerals (MULTIVITAMIN ADULT PO), Take 1 tablet by mouth daily.    Reviewed prior external information including notes and imaging from  primary care provider As well as notes that were available from care everywhere and other healthcare systems.  Past medical history, social, surgical and family history all reviewed in electronic medical record.  No pertanent information unless stated regarding to the  chief complaint.   Review of Systems:  No headache, visual changes, nausea, vomiting, diarrhea, constipation, dizziness, abdominal pain, skin rash, fevers, chills, night sweats, weight loss, swollen lymph nodes, joint swelling, chest pain, shortness of breath, mood changes. POSITIVE muscle aches, body aches  Objective  Blood pressure 140/90, pulse (!) 103, height 5\' 3"  (1.6 m), weight 237 lb (107.5 kg), last menstrual period 02/07/2020, SpO2 98 %.   General: No apparent distress alert and oriented x3 mood and affect normal, dressed appropriately. Overweight HEENT: Pupils equal, extraocular movements intact  Respiratory: Patient's speak in full sentences and does not appear short of breath  Cardiovascular: No lower extremity edema, non tender, no erythema  Gait normal with good balance and coordination.  MSK: Patient's neck exam has some mild loss of lordosis. Some tightness noted with sidebending. Negative Spurling's. Patient does have soreness noted in the parascapular region bilaterally. No midline tenderness. Low back exam has more tightness in the thoracolumbar juncture. Difficulty with Corky Sox secondary to body habitus. Negative straight leg test. Neurovascularly intact distally.  Osteopathic findings C3 flexed rotated and side bent right C6 flexed rotated and side bent left T5 extended rotated and side bent right inhaled third rib L1 flexed rotated and side bent left Sacrum right on right      Impression and Recommendations:     The above documentation has been reviewed and is accurate and complete Lyndal Pulley, DO

## 2020-02-18 ENCOUNTER — Other Ambulatory Visit: Payer: Self-pay

## 2020-02-18 ENCOUNTER — Ambulatory Visit (INDEPENDENT_AMBULATORY_CARE_PROVIDER_SITE_OTHER): Payer: No Typology Code available for payment source | Admitting: Family Medicine

## 2020-02-18 ENCOUNTER — Encounter: Payer: Self-pay | Admitting: Family Medicine

## 2020-02-18 ENCOUNTER — Ambulatory Visit (INDEPENDENT_AMBULATORY_CARE_PROVIDER_SITE_OTHER): Payer: No Typology Code available for payment source

## 2020-02-18 VITALS — BP 140/90 | HR 103 | Ht 63.0 in | Wt 237.0 lb

## 2020-02-18 DIAGNOSIS — M542 Cervicalgia: Secondary | ICD-10-CM

## 2020-02-18 DIAGNOSIS — M999 Biomechanical lesion, unspecified: Secondary | ICD-10-CM

## 2020-02-18 DIAGNOSIS — G8929 Other chronic pain: Secondary | ICD-10-CM

## 2020-02-18 DIAGNOSIS — M5442 Lumbago with sciatica, left side: Secondary | ICD-10-CM

## 2020-02-18 DIAGNOSIS — M546 Pain in thoracic spine: Secondary | ICD-10-CM

## 2020-02-18 NOTE — Assessment & Plan Note (Signed)
   Decision today to treat with OMT was based on Physical Exam  After verbal consent patient was treated with HVLA, ME, FPR techniques in cervical, thoracic, rib, lumbar and sacral areas,   Patient tolerated the procedure well with improvement in symptoms  Patient given exercises, stretches and lifestyle modifications  See medications in patient instructions if given  Patient will follow up in 6-8 weeks

## 2020-02-18 NOTE — Assessment & Plan Note (Signed)
Patient does have tightness noted more in the thoracolumbar juncture as well as in the scapular region. Home exercises given and work with Product/process development scientist. We discussed also formal physical therapy that I think will be beneficial and patient was referred today. We will get x-rays of the cervical and thoracic spine with patient already having x-rays of the low back. Patient is responding somewhat to the Cymbalta it sounds like and does use the Flexeril as needed. We discussed some over-the-counter vitamin supplementations that could be beneficial. Encourage weight loss. Follow-up with me again in 6 weeks.

## 2020-02-18 NOTE — Patient Instructions (Addendum)
Good to see you Scapular exercises today PT will call you Neck and thoracic xray Tart cherry extract 1200mg  at night Vitamin D 2000 IU daily  Follow up with other doctors See me again in 5-6 weeks

## 2020-02-19 ENCOUNTER — Encounter: Payer: Self-pay | Admitting: Nurse Practitioner

## 2020-02-19 ENCOUNTER — Ambulatory Visit: Payer: Self-pay | Attending: Nurse Practitioner | Admitting: Nurse Practitioner

## 2020-02-19 ENCOUNTER — Other Ambulatory Visit: Payer: Self-pay

## 2020-02-19 DIAGNOSIS — E669 Obesity, unspecified: Secondary | ICD-10-CM

## 2020-02-19 DIAGNOSIS — K089 Disorder of teeth and supporting structures, unspecified: Secondary | ICD-10-CM

## 2020-02-19 NOTE — Progress Notes (Signed)
Virtual Visit via Telephone Note Due to national recommendations of social distancing due to [place] 19, telehealth visit is felt to be most appropriate for this patient at this time.  I discussed the limitations, risks, security and privacy concerns of performing an evaluation and management service by telephone and the availability of in person appointments. I also discussed with the patient that there may be a patient responsible charge related to this service. The patient expressed understanding and agreed to proceed.    I connected with Kristin [name] on 02/19/20  at   9:10 AM EST  EDT by telephone and verified that I am speaking with the correct person using two identifiers.   Consent I discussed the limitations, risks, security and privacy concerns of performing an evaluation and management service by telephone and the availability of in person appointments. I also discussed with the patient that there may be a patient responsible charge related to this service. The patient expressed understanding and agreed to proceed.   Location of Patient: Private Residence   Location of Provider: [facility]    Persons participating in Telemedicine visit: [name] FNP-BC [place] Interpreter ID# [id number]   History of Present Illness: Telemedicine visit for: Follow up OBESITY  She has questions regarding her prediabetes. We did have a lengthy discussion regarding weight and diet. She will be working on losing weight before her next office visit. Denies chest pain, shortness of breath, palpitations, lightheadedness, dizziness, headaches or BLE edema.    Past Medical History:  Diagnosis Date  . Ectopic pregnancy   . Nephrolithiasis     Past Surgical History:  Procedure Laterality Date  . etopic pregnancy      Family History  Problem Relation Age of Onset  . Breast cancer Neg Hx     Social  History   Socioeconomic History  . Marital status: Single    Spouse name: Not on file  . Number of children: 3  . Years of education: Not on file  . Highest education level: 6th grade  Occupational History  . Not on file  Tobacco Use  . Smoking status: Never Smoker  . Smokeless tobacco: Never Used  Vaping Use  . Vaping Use: Never used  Substance and Sexual Activity  . Alcohol use: Yes    Comment: occasionally   . Drug use: No  . Sexual activity: Yes  Other Topics Concern  . Not on file  Social History Narrative  . Not on file   Social Determinants of Health   Financial Resource Strain: Not on file  Food Insecurity: Not on file  Transportation Needs: Not on file  Physical Activity: Not on file  Stress: Not on file  Social Connections: Not on file     Observations/Objective: Awake, alert and oriented x 3   Review of Systems  Constitutional: Negative for fever, malaise/fatigue and weight loss.  HENT: Negative.  Negative for nosebleeds.   Eyes: Negative.  Negative for blurred vision, double vision and photophobia.  Respiratory: Negative.  Negative for cough and shortness of breath.   Cardiovascular: Negative.  Negative for chest pain, palpitations and leg swelling.  Gastrointestinal: Negative.  Negative for heartburn, nausea and vomiting.  Musculoskeletal: Negative.  Negative for myalgias.  Neurological: Negative.  Negative for dizziness, focal weakness, seizures and headaches.  Psychiatric/Behavioral: Negative.  Negative for suicidal ideas.    Assessment and Plan: Kristin [name] was seen today for follow-up.  Diagnoses and all orders for this visit:  Obesity (BMI 30-39.9) Discussed diet and exercise for person with BMI >25. Instructed: You must burn more calories than you eat. Losing 5 percent of your body weight should be considered a success. In the longer term, losing more than 15 percent of your body weight and staying at this weight is an extremely good result.  However, keep in mind that even losing 5 percent of your body weight leads to important health benefits, so try not to get discouraged if you're not able to lose more than this. Will recheck weight in 3-6 months.  Poor dentition -     Ambulatory referral to Dentistry     Follow Up Instructions Return in about 3 months (around 05/18/2020).     I discussed the assessment and treatment plan with the patient. The patient was provided an opportunity to ask questions and all were answered. The patient agreed with the plan and demonstrated an understanding of the instructions.   The patient was advised to call back or seek an in-person evaluation if the symptoms worsen or if the condition fails to improve as anticipated.  I provided 14 minutes of non-face-to-face time during this encounter including median intraservice time, reviewing previous notes, labs, imaging, medications and explaining diagnosis and management.  Gildardo Pounds, FNP-BC

## 2020-02-26 ENCOUNTER — Ambulatory Visit: Payer: No Typology Code available for payment source | Attending: Nurse Practitioner

## 2020-02-26 ENCOUNTER — Other Ambulatory Visit: Payer: Self-pay | Admitting: Nurse Practitioner

## 2020-02-26 ENCOUNTER — Other Ambulatory Visit: Payer: Self-pay

## 2020-02-26 DIAGNOSIS — E669 Obesity, unspecified: Secondary | ICD-10-CM

## 2020-02-26 DIAGNOSIS — Z1211 Encounter for screening for malignant neoplasm of colon: Secondary | ICD-10-CM

## 2020-02-26 DIAGNOSIS — R7303 Prediabetes: Secondary | ICD-10-CM

## 2020-02-27 LAB — CMP14+EGFR
ALT: 16 IU/L (ref 0–32)
AST: 19 IU/L (ref 0–40)
Albumin/Globulin Ratio: 1.4 (ref 1.2–2.2)
Albumin: 4.5 g/dL (ref 3.8–4.8)
Alkaline Phosphatase: 87 IU/L (ref 44–121)
BUN/Creatinine Ratio: 22 (ref 9–23)
BUN: 15 mg/dL (ref 6–24)
Bilirubin Total: 0.4 mg/dL (ref 0.0–1.2)
CO2: 21 mmol/L (ref 20–29)
Calcium: 9.2 mg/dL (ref 8.7–10.2)
Chloride: 102 mmol/L (ref 96–106)
Creatinine, Ser: 0.69 mg/dL (ref 0.57–1.00)
GFR calc Af Amer: 121 mL/min/{1.73_m2} (ref >59)
GFR calc non Af Amer: 105 mL/min/{1.73_m2} (ref >59)
Globulin, Total: 3.2 g/dL (ref 1.5–4.5)
Glucose: 102 mg/dL — ABNORMAL HIGH (ref 65–99)
Potassium: 4.1 mmol/L (ref 3.5–5.2)
Sodium: 140 mmol/L (ref 134–144)
Total Protein: 7.7 g/dL (ref 6.0–8.5)

## 2020-02-27 LAB — HEMOGLOBIN A1C
Est. average glucose Bld gHb Est-mCnc: 128 mg/dL
Hgb A1c MFr Bld: 6.1 % — ABNORMAL HIGH (ref 4.8–5.6)

## 2020-02-27 LAB — LIPID PANEL
Chol/HDL Ratio: 4.2 ratio (ref 0.0–4.4)
Cholesterol, Total: 249 mg/dL — ABNORMAL HIGH (ref 100–199)
HDL: 60 mg/dL (ref >39)
LDL Chol Calc (NIH): 172 mg/dL — ABNORMAL HIGH (ref 0–99)
Triglycerides: 100 mg/dL (ref 0–149)
VLDL Cholesterol Cal: 17 mg/dL (ref 5–40)

## 2020-03-04 ENCOUNTER — Encounter: Payer: Self-pay | Admitting: Physical Medicine and Rehabilitation

## 2020-03-04 ENCOUNTER — Encounter
Payer: No Typology Code available for payment source | Attending: Physical Medicine and Rehabilitation | Admitting: Physical Medicine and Rehabilitation

## 2020-03-04 ENCOUNTER — Other Ambulatory Visit: Payer: Self-pay

## 2020-03-04 VITALS — BP 134/85 | HR 105 | Temp 98.7°F | Ht 63.0 in | Wt 237.2 lb

## 2020-03-04 DIAGNOSIS — M546 Pain in thoracic spine: Secondary | ICD-10-CM

## 2020-03-04 DIAGNOSIS — G8929 Other chronic pain: Secondary | ICD-10-CM

## 2020-03-04 DIAGNOSIS — M5442 Lumbago with sciatica, left side: Secondary | ICD-10-CM

## 2020-03-04 DIAGNOSIS — M7918 Myalgia, other site: Secondary | ICD-10-CM

## 2020-03-04 MED ORDER — DICLOFENAC SODIUM 75 MG PO TBEC: 75.0000 mg | DELAYED_RELEASE_TABLET | Freq: Two times a day (BID) | ORAL | 5 refills | Status: DC | PRN

## 2020-03-04 NOTE — Patient Instructions (Signed)
  Patient is a 45 yr old female with hx of obesity on Phentermine and Chronic thoracic back pain here forf/u.  1. Will see f/u with Dr Tamala Julian- and follow his suggestions for exercises and vitamins and stretches- it's working and if stops working, will then look into trigger point injections.  2. Con't Diclofenac prn 3. Con't Cherry tart extract 1200 mg daily by Dr Tamala Julian- sleeps better  4. Cont Duloxetine 30 mg nightly.    5. Will con't F/U with Dr Tamala Julian  6. F/U in 4 months after sees Dr Tamala Julian

## 2020-03-04 NOTE — Progress Notes (Signed)
Subjective:    Patient ID: Kristin [name], female    DOB: 1975/10/26, 45 y.o.   MRN: [id number]  HPI   Patient is a 45 yr old female with hx of obesity on Phentermine and Chronic thoracic back pain here forf/u.   Saw Dr Tamala Julian- got Squished"-  Very relaxed in neck and showed how to do stretches.  Feels well-   Now can hear a pop when twists head back and forth- feels good  Pain is middle back and will wrap around L torso/anteriorly.   Still taking Duloxetine- taking every night.  Taking - Diclofenac as needed- 1-2x/week- not very often.   Pain comes and goes fast, so can't plan for it.  Stopped the muscle relaxant as we discussed.  Feeling well on new vitamins.  And doing stretches- which is great.   Was given a pill by Dr Tamala JulianMarcelline Mates tart extract 1200 mg nightly  Pain Inventory Average Pain 0 Pain Right Now 0 My pain is no pain  In the last 24 hours, has pain interfered with the following? General activity 0 Relation with others 0 Enjoyment of life 0 What TIME of day is your pain at its worst? evening Sleep (in general) Good  Pain is worse with: no pain Pain improves with: no pain Relief from Meds: no pain  Family History  Problem Relation Age of Onset  . Breast cancer Neg Hx    Social History   Socioeconomic History  . Marital status: Single    Spouse name: Not on file  . Number of children: 3  . Years of education: Not on file  . Highest education level: 6th grade  Occupational History  . Not on file  Tobacco Use  . Smoking status: Never Smoker  . Smokeless tobacco: Never Used  Vaping Use  . Vaping Use: Never used  Substance and Sexual Activity  . Alcohol use: Yes    Comment: occasionally   . Drug use: No  . Sexual activity: Yes  Other Topics Concern  . Not on file  Social History Narrative  . Not on file   Social Determinants of Health   Financial Resource Strain: Not on file  Food Insecurity: Not on file  Transportation  Needs: Not on file  Physical Activity: Not on file  Stress: Not on file  Social Connections: Not on file   Past Surgical History:  Procedure Laterality Date  . etopic pregnancy     Past Surgical History:  Procedure Laterality Date  . etopic pregnancy     Past Medical History:  Diagnosis Date  . Ectopic pregnancy   . Nephrolithiasis    BP 134/85   Pulse (!) 105   Temp 98.7 F (37.1 C)   Ht 5\' 3"  (1.6 m)   Wt 237 lb 3.2 oz (107.6 kg)   LMP 02/07/2020   SpO2 97%   BMI 42.02 kg/m   Opioid Risk Score:   Fall Risk Score:  `1  Depression screen PHQ 2/9  Depression screen [facility] 2/9 09/23/2019 07/10/2019 04/03/2019 10/22/2018 09/10/2018 05/09/2018 03/14/2018  Decreased Interest 0 0 0 0 0 0 0  Down, Depressed, Hopeless 0 0 0 0 0 2 1  PHQ - 2 Score 0 0 0 0 0 2 1  Altered sleeping 0 - 0 0 0 1 0  Tired, decreased energy 0 - 0 0 0 0 0  Change in appetite 0 - 0 0 0 0 0  Feeling bad or failure  about yourself  0 - 0 0 0 0 0  Trouble concentrating 0 - 0 0 0 0 0  Moving slowly or fidgety/restless 0 - 0 0 0 0 0  Suicidal thoughts 0 - 0 0 0 0 0  PHQ-9 Score 0 - 0 0 0 3 1  Some recent data might be hidden    Review of Systems    an review of systems was completed- and found to be negative.  Objective:   Physical Exam Awake,alert, appropriate, accompanied by interpretor, NAD No TTP across middle or low back anymore Also, trigger point are almost gone on exam esp in shoulders, neck and upper/mid back.        Assessment & Plan:    Patient is a 45 yr old female with hx of obesity on Phentermine and Chronic thoracic back pain here forf/u.  1. Will see f/u with Dr Tamala Julian- and follow his suggestions for exercises and vitamins and stretches- it's working and if stops working, will then look into trigger point injections.  2. Con't Diclofenac prn 3. Con't Cherry tart extract 1200 mg daily by Dr Tamala Julian- sleeps better  4. Cont Duloxetine 30 mg nightly.  Has 7+ refills left.   5. Will  con't F/U with Dr Tamala Julian  6. F/U in 4 months after sees Dr Tamala Julian.    I spent a total of 20 minutes on visit- as detailed above.

## 2020-03-24 NOTE — Progress Notes (Signed)
[place] Phone: [phone] Subjective:   Kristin Kristin [name], am serving as a scribe for Dr. Hulan Saas. This visit occurred during the SARS-CoV-2 public health emergency.  Safety protocols were in place, including screening questions prior to the visit, additional usage of staff PPE, and extensive cleaning of exam room while observing appropriate contact time as indicated for disinfecting solutions.   I'm seeing this patient by the request  of:  Gildardo Pounds, NP  CC: back pain follow up   [id number]  Kristin Kristin [name] is a 45 y.o. female coming in with complaint of back and neck pain. OMT 02/18/2020. Patient states that her pain has been doing better. Stretching seems to help manage her pain.   Medications patient has been prescribed: None  Taking: Taking Cymbalta from another provider and does have Voltaren and Flexeril as well.    Patient did have x-rays at last exam that were independently visualized by me today showing that patient did have normal cervical x-rays but does have slight scoliosis of the thoracic spine with very mild degenerative disc disease.  Patient is accompanied with a translator today.   Reviewed prior external information including notes and imaging from previsou exam, outside providers and external EMR if available.   As well as notes that were available from care everywhere and other healthcare systems.  Past medical history, social, surgical and family history all reviewed in electronic medical record.  Kristin [name] pertanent information unless stated regarding to the chief complaint.   Past Medical History:  Diagnosis Date  . Ectopic pregnancy   . Nephrolithiasis     Allergies  Allergen Reactions  . Wellbutrin [Bupropion] Rash     Review of Systems:  Kristin [name] headache, visual changes, nausea, vomiting, diarrhea, constipation, dizziness, abdominal pain, skin rash, fevers,  chills, night sweats, weight loss, swollen lymph nodes,  joint swelling, chest pain, shortness of breath, mood changes. POSITIVE muscle aches, body aches  Objective  Blood pressure 110/82, pulse 88, height 5\' 3"  (1.6 m), weight 237 lb (107.5 kg), SpO2 98 %.   General: Kristin [name] apparent distress alert and oriented x3 mood and affect normal, dressed appropriately.  HEENT: Pupils equal, extraocular movements intact  Respiratory: Patient's speak in full sentences and does not appear short of breath  Cardiovascular: Kristin [name] lower extremity edema, non tender, Kristin [name] erythema   Gait normal with good balance and coordination.  MSK:  Non tender with full range of motion and good stability and symmetric strength and tone of shoulders, elbows, wrist, hip, knee and ankles bilaterally.  Back -Kristin [name] back exam still shows tightness noted at the left scapular area.  Patient does have some mild scapular dyskinesis still noted.  Patient also has some mild loss of lordosis of the neck.  Patient's lower back has some poor core strength.  Tender to palpation more in the thoracolumbar juncture.  Mild over the right sacroiliac joint.  Osteopathic findings  C2 flexed rotated and side bent right C6 flexed rotated and side bent left T3 extended rotated and side bent left inhaled rib L1 flexed rotated and side bent left Sacrum right on right       Assessment and Plan:  Chronic left-sided thoracic back pain Patient is responding fairly well to osteopathic manipulation.  Patient is continuing the duloxetine that was provided by another provider.  Patient is doing the over-the-counter medications.  Hopefully patient will continue to make improvement.  Patient will follow  up with me again in 4 to 8 weeks.  Total time with patient reviewing the chart as well as discussing with patient through the translator 36 minutes.    Nonallopathic problems  Decision today to treat with OMT was based on Physical Exam  After verbal consent  patient was treated with HVLA, ME, FPR techniques in cervical, rib, thoracic, lumbar, and sacral  areas  Patient tolerated the procedure well with improvement in symptoms  Patient given exercises, stretches and lifestyle modifications  See medications in patient instructions if given  Patient will follow up in 4-8 weeks      The above documentation has been reviewed and is accurate and complete Lyndal Pulley, DO       Note: This dictation was prepared with Dragon dictation along with smaller phrase technology. Any transcriptional errors that result from this process are unintentional.

## 2020-03-25 ENCOUNTER — Other Ambulatory Visit: Payer: Self-pay

## 2020-03-25 ENCOUNTER — Encounter: Payer: Self-pay | Admitting: Family Medicine

## 2020-03-25 ENCOUNTER — Ambulatory Visit (INDEPENDENT_AMBULATORY_CARE_PROVIDER_SITE_OTHER): Payer: No Typology Code available for payment source | Admitting: Family Medicine

## 2020-03-25 VITALS — BP 110/82 | HR 88 | Ht 63.0 in | Wt 237.0 lb

## 2020-03-25 DIAGNOSIS — M546 Pain in thoracic spine: Secondary | ICD-10-CM

## 2020-03-25 DIAGNOSIS — M999 Biomechanical lesion, unspecified: Secondary | ICD-10-CM

## 2020-03-25 DIAGNOSIS — G8929 Other chronic pain: Secondary | ICD-10-CM

## 2020-03-25 NOTE — Patient Instructions (Signed)
Keep doing what you are doing 30 minute appointment See me again in 6-7 weeks

## 2020-03-25 NOTE — Assessment & Plan Note (Signed)
Patient is responding fairly well to osteopathic manipulation.  Patient is continuing the duloxetine that was provided by another provider.  Patient is doing the over-the-counter medications.  Hopefully patient will continue to make improvement.  Patient will follow up with me again in 4 to 8 weeks.  Total time with patient reviewing the chart as well as discussing with patient through the translator 36 minutes.

## 2020-05-05 NOTE — Progress Notes (Deleted)
  [place] Phone: [phone] Subjective:    I'm seeing this patient by the request  of:  Gildardo Pounds, NP  CC:   [id number]  Kristin [name] is a 45 y.o. female coming in with complaint of back and neck pain. OMT 03/25/2020. Patient states   Medications patient has been prescribed: None  Taking:         Reviewed prior external information including notes and imaging from previsou exam, outside providers and external EMR if available.   As well as notes that were available from care everywhere and other healthcare systems.  Past medical history, social, surgical and family history all reviewed in electronic medical record.  No pertanent information unless stated regarding to the chief complaint.   Past Medical History:  Diagnosis Date  . Ectopic pregnancy   . Nephrolithiasis     Allergies  Allergen Reactions  . Wellbutrin [Bupropion] Rash     Review of Systems:  No headache, visual changes, nausea, vomiting, diarrhea, constipation, dizziness, abdominal pain, skin rash, fevers, chills, night sweats, weight loss, swollen lymph nodes, body aches, joint swelling, chest pain, shortness of breath, mood changes. POSITIVE muscle aches  Objective  There were no vitals taken for this visit.   General: No apparent distress alert and oriented x3 mood and affect normal, dressed appropriately.  HEENT: Pupils equal, extraocular movements intact  Respiratory: Patient's speak in full sentences and does not appear short of breath  Cardiovascular: No lower extremity edema, non tender, no erythema  Neuro: Cranial nerves II through XII are intact, neurovascularly intact in all extremities with 2+ DTRs and 2+ pulses.  Gait normal with good balance and coordination.  MSK:  Non tender with full range of motion and good stability and symmetric strength and tone of shoulders, elbows, wrist, hip, knee and  ankles bilaterally.  Back - Normal skin, Spine with normal alignment and no deformity.  No tenderness to vertebral process palpation.  Paraspinous muscles are not tender and without spasm.   Range of motion is full at neck and lumbar sacral regions  Osteopathic findings  C2 flexed rotated and side bent right C6 flexed rotated and side bent left T3 extended rotated and side bent right inhaled rib T9 extended rotated and side bent left L2 flexed rotated and side bent right Sacrum right on right       Assessment and Plan:    Nonallopathic problems  Decision today to treat with OMT was based on Physical Exam  After verbal consent patient was treated with HVLA, ME, FPR techniques in cervical, rib, thoracic, lumbar, and sacral  areas  Patient tolerated the procedure well with improvement in symptoms  Patient given exercises, stretches and lifestyle modifications  See medications in patient instructions if given  Patient will follow up in 4-8 weeks      The above documentation has been reviewed and is accurate and complete Jacqualin Combes       Note: This dictation was prepared with Dragon dictation along with smaller phrase technology. Any transcriptional errors that result from this process are unintentional.

## 2020-05-06 ENCOUNTER — Ambulatory Visit: Payer: No Typology Code available for payment source | Admitting: Family Medicine

## 2020-05-20 ENCOUNTER — Ambulatory Visit: Payer: Self-pay | Attending: Nurse Practitioner | Admitting: Nurse Practitioner

## 2020-05-20 ENCOUNTER — Encounter: Payer: Self-pay | Admitting: Nurse Practitioner

## 2020-05-20 ENCOUNTER — Other Ambulatory Visit: Payer: Self-pay

## 2020-05-20 NOTE — Progress Notes (Signed)
USED SPANISH INTERPRETER   [phone] is not in service  [phone] Unable to reach. LVM. Needs to reschedule appt.

## 2020-06-22 ENCOUNTER — Ambulatory Visit: Payer: Self-pay | Attending: Family Medicine | Admitting: Family Medicine

## 2020-06-22 ENCOUNTER — Other Ambulatory Visit: Payer: Self-pay

## 2020-06-22 ENCOUNTER — Encounter: Payer: Self-pay | Admitting: Family Medicine

## 2020-06-22 VITALS — BP 133/82 | HR 110 | Ht 63.0 in | Wt 239.0 lb

## 2020-06-22 DIAGNOSIS — L719 Rosacea, unspecified: Secondary | ICD-10-CM

## 2020-06-22 DIAGNOSIS — H1013 Acute atopic conjunctivitis, bilateral: Secondary | ICD-10-CM

## 2020-06-22 DIAGNOSIS — J302 Other seasonal allergic rhinitis: Secondary | ICD-10-CM

## 2020-06-22 MED ORDER — FLUTICASONE PROPIONATE 50 MCG/ACT NA SUSP: 2.0000 | Freq: Every day | NASAL | 1 refills | Status: DC

## 2020-06-22 MED ORDER — METRONIDAZOLE 1 % EX GEL: Freq: Every day | CUTANEOUS | 1 refills | Status: DC

## 2020-06-22 MED ORDER — OLOPATADINE HCL 0.1 % OP SOLN: 1.0000 [drp] | Freq: Two times a day (BID) | OPHTHALMIC | 1 refills | Status: DC

## 2020-06-22 NOTE — Progress Notes (Signed)
Subjective:  Patient ID: Kristin [name], female    DOB: [birth year]  Age: 45 y.o. MRN: [id number]  CC: Allergies   HPI Tirzah Fross [name] is a 45 year old female patient of [name], Np with a history of prediabetes, Morbid obesity who presents today for an acute visit.  Interval History: She has had itchy eyes and ears for the last 3 days. Used Claritin and Robitussin. She has not noticed erythema of her eyes or visual concerns. This is usual for her with the allergy season. Also has sore throat as well but no voice hoarseness, no sinus pain or pressure but she does have rhinorrhea. She has a malar rash which she had 2 years ago and it returned recently.  She noticed it is worse with her period but denies presence of photosensitivity.  She describes this as a rash with acne on her cheeks and forehead.  Denies presence of joint pains, myalgias, alopecia, fever. Past Medical History:  Diagnosis Date   Ectopic pregnancy    Nephrolithiasis     Past Surgical History:  Procedure Laterality Date   etopic pregnancy      Family History  Problem Relation Age of Onset   Breast cancer Neg Hx     Allergies  Allergen Reactions   Wellbutrin [Bupropion] Rash    Outpatient Medications Prior to Visit  Medication Sig Dispense Refill   cholecalciferol (VITAMIN D3) 25 MCG (1000 UNIT) tablet Take 1,000 Units by mouth daily.     cyclobenzaprine (FLEXERIL) 10 MG tablet Take 1 tablet (10 mg total) by mouth at bedtime as needed for muscle spasms. 30 tablet 5   diclofenac (VOLTAREN) 75 MG EC tablet Take 1 tablet (75 mg total) by mouth 2 (two) times daily as needed. For back pain 60 tablet 5   DULoxetine (CYMBALTA) 30 MG capsule Take 1 capsule (30 mg total) by mouth at bedtime. For nerve pain 30 capsule 11   Multiple Vitamins-Minerals (MULTIVITAMIN ADULT PO) Take 1 tablet by mouth daily.      No facility-administered medications prior to visit.     ROS Review of Systems   Constitutional:  Negative for activity change, appetite change and fatigue.  HENT:  Positive for postnasal drip and sore throat. Negative for congestion and sinus pressure.   Eyes:  Positive for itching. Negative for visual disturbance.  Respiratory:  Negative for cough, chest tightness, shortness of breath and wheezing.   Cardiovascular:  Negative for chest pain and palpitations.  Gastrointestinal:  Negative for abdominal distention, abdominal pain and constipation.  Endocrine: Negative for polydipsia.  Genitourinary:  Negative for dysuria and frequency.  Musculoskeletal:  Negative for arthralgias and back pain.  Skin:  Positive for rash.  Neurological:  Negative for tremors, light-headedness and numbness.  Hematological:  Does not bruise/bleed easily.  Psychiatric/Behavioral:  Negative for agitation and behavioral problems.    Objective:  BP 133/82   Pulse (!) 110   Ht 5\' 3"  (1.6 m)   Wt 239 lb (108.4 kg)   SpO2 100%   BMI 42.34 kg/m   BP/Weight 06/22/2020 03/25/2020 2/86/3817  Systolic BP 711 657 903  Diastolic BP 82 82 85  Wt. (Lbs) 239 237 237.2  BMI 42.34 41.98 42.02      Physical Exam Constitutional:      Appearance: She is well-developed.  HENT:     Head:     Comments: No sinus tenderness    Right Ear: Tympanic membrane normal.     Left  Ear: Tympanic membrane normal.     Mouth/Throat:     Mouth: Mucous membranes are moist.  Neck:     Vascular: No JVD.  Cardiovascular:     Rate and Rhythm: Tachycardia present.     Heart sounds: Normal heart sounds. No murmur heard. Pulmonary:     Effort: Pulmonary effort is normal.     Breath sounds: Normal breath sounds. No wheezing or rales.  Chest:     Chest wall: No tenderness.  Abdominal:     General: Bowel sounds are normal. There is no distension.     Palpations: Abdomen is soft. There is no mass.     Tenderness: There is no abdominal tenderness.  Musculoskeletal:        General: Normal range of motion.      Right lower leg: No edema.     Left lower leg: No edema.  Skin:    Comments: Erythematous rash in the Malar region and forehead  Neurological:     Mental Status: She is alert and oriented to person, place, and time.  Psychiatric:        Mood and Affect: Mood normal.    CMP Latest Ref Rng & Units 02/26/2020 09/23/2019 04/10/2019  Glucose 65 - 99 mg/dL 102(H) 88 96  BUN 6 - 24 mg/dL 15 17 12   Creatinine 0.57 - 1.00 mg/dL 0.69 0.63 0.75  Sodium 134 - 144 mmol/L 140 139 139  Potassium 3.5 - 5.2 mmol/L 4.1 4.8 4.2  Chloride 96 - 106 mmol/L 102 103 103  CO2 20 - 29 mmol/L 21 25 23   Calcium 8.7 - 10.2 mg/dL 9.2 9.9 9.4  Total Protein 6.0 - 8.5 g/dL 7.7 - 7.1  Total Bilirubin 0.0 - 1.2 mg/dL 0.4 - 0.4  Alkaline Phos 44 - 121 IU/L 87 - 74  AST 0 - 40 IU/L 19 - 18  ALT 0 - 32 IU/L 16 - 13    Lipid Panel     Component Value Date/Time   CHOL 249 (H) 02/26/2020 1050   TRIG 100 02/26/2020 1050   HDL 60 02/26/2020 1050   CHOLHDL 4.2 02/26/2020 1050   LDLCALC 172 (H) 02/26/2020 1050    CBC    Component Value Date/Time   WBC 8.6 09/23/2019 1621   WBC 9.4 07/09/2014 1706   RBC 4.62 09/23/2019 1621   RBC 4.66 07/09/2014 1706   HGB 13.8 09/23/2019 1621   HCT 41.5 09/23/2019 1621   PLT 228 09/23/2019 1621   MCV 90 09/23/2019 1621   MCH 29.9 09/23/2019 1621   MCH 28.8 07/09/2014 1706   MCHC 33.3 09/23/2019 1621   MCHC 33.8 07/09/2014 1706   RDW 13.2 09/23/2019 1621    Lab Results  Component Value Date   HGBA1C 6.1 (H) 02/26/2020    Assessment & Plan:  1. Seasonal allergies She is currently taking Claritin at home and has been advised to continue with this Will add on Flonase - fluticasone (FLONASE) 50 MCG/ACT nasal spray; Place 2 sprays into both nostrils daily.  Dispense: 16 g; Refill: 1  2. Allergic conjunctivitis of both eyes - olopatadine (PATADAY) 0.1 % ophthalmic solution; Place 1 drop into both eyes 2 (two) times daily.  Dispense: 5 mL; Refill: 1  3. Rosacea Will  treat for rosacea but will also need to exclude possible autoimmune condition given presence of Malar rash - metroNIDAZOLE (METROGEL) 1 % gel; Apply topically daily.  Dispense: 60 g; Refill: 1 - ANA,IFA RA Diag Pnl w/rflx  Tit/Patn - Anti-DNA antibody, double-stranded    Meds ordered this encounter  Medications   fluticasone (FLONASE) 50 MCG/ACT nasal spray    Sig: Place 2 sprays into both nostrils daily.    Dispense:  16 g    Refill:  1   olopatadine (PATADAY) 0.1 % ophthalmic solution    Sig: Place 1 drop into both eyes 2 (two) times daily.    Dispense:  5 mL    Refill:  1   metroNIDAZOLE (METROGEL) 1 % gel    Sig: Apply topically daily.    Dispense:  60 g    Refill:  1    Follow-up: Return if symptoms worsen or fail to improve.       Charlott Rakes, MD, FAAFP. [facility] and [place], [place]   06/22/2020, 12:12 PM

## 2020-06-22 NOTE — Progress Notes (Signed)
Discuss allergies, having itchy and water eyes. Sore throat.

## 2020-06-22 NOTE — Patient Instructions (Signed)
Roscea Rosacea La roscea es una afeccin a largo plazo (crnica) que afecta la piel de la cara, lo que incluye las Bethany, la nariz, la frente y IT consultant. Esta afeccin puede afectar tambin los ojos. La roscea hace que los vasos sanguneos que se encuentran cerca de la superficie de lapiel se agranden, lo que provoca enrojecimiento. Cules son las causas? Se desconoce la causa de esta afeccin. Algunos factores desencadenantes pueden hacer que la roscea empeore, por ejemplo: Los baos calientes. Realiza actividad fsica. La luz del sol. Las temperaturas muy clidas o fras. Los alimentos y las bebidas muy calientes o condimentados. El consumo de alcohol. El estrs. Los medicamentos para la presin arterial. El uso de corticoesteroides tpicos en el rostro por un tiempo prolongado. Qu incrementa el riesgo? Es ms probable que tenga esta afeccin si: Es mayor de 34 aos de edad. Es mujer. Tiene la piel clara (tez clara). Tiene antecedentes familiares de roscea. Cules son los signos o los sntomas? Los sntomas de esta afeccin incluyen: Enrojecimiento del rostro. Protuberancias o granos rojos en la cara. Doran Durand roja y agrandada. Ruborizarse fcilmente. Lneas rojas en la piel. Irritacin, ardor o picazn en los ojos. Hinchazn de los prpados. Secrecin que [employer] ojos. Sensacin de [employer]. Cmo se diagnostica? Esta afeccin se diagnostica mediante una revisin de los antecedentes mdicosy un examen fsico. Cmo se trata? No hay una cura para esta afeccin, pero el tratamiento puede ayudar a [employer] sntomas. Su mdico puede recomendarle que consulte a un especialista en la piel (dermatlogo). El tratamiento puede incluir: Medicamentos que se aplican en la piel o se toman por boca (por va oral). Esto puede incluir antibiticos. Tratamiento con lser para mejorar el aspecto de la piel. Clementeen Hoof. Esto es poco frecuente. El mdico  tambin le recomendar la mejor forma de cuidarse la piel. An despus de que su piel mejore, es probable que necesite continuar con eltratamiento para evitar que la roscea vuelva a aparecer. Siga estas indicaciones en su casa: Cuidado de la piel Cudese la piel como se lo haya indicado el mdico. Tal vez le indiquen que haga lo siguiente: Lvese la piel delicadamente al [employer] veces por da. Use un jabn suave. Use una pantalla o un protector solar con factor de proteccin solar (FPS) de 30 o ms. Use cosmticos especiales para piel sensible. Use una afeitadora elctrica en lugar de una cuchilla para afeitarse. Estilo de vida Intente hacer un seguimiento respecto a qu alimentos Biochemist, clinical. Evite los factores desencadenantes. Estos pueden incluir: Comidas condimentadas. Mariscos. Queso. Lquidos calientes. Frutos secos. Chocolate. Sal yodada. No beba alcohol. Evite las temperaturas extremadamente fras o clidas. Trate de reducir los niveles de estrs. Si necesita ayuda, consulte al mdico. Cuando haga ejercicio, haga estas cosas para no acalorarse: Limite la exposicin del rostro al sol. Use un ventilador. Realice intervalos de ejercicio ms cortos, pero ms frecuentes. Indicaciones generales [place] y aplquese los medicamentos de venta libre y los recetados solamente como se lo haya indicado el mdico. Si le recetaron un antibitico, aplqueselo o tmelo como se lo haya indicado el mdico. No deje de usar el antibitico aunque la afeccin mejore. Si la roscea le ha [employer] prpados, aplique una compresa tibia sobre ellos. Haga esto como se lo haya indicado el mdico. Concurra a todas las visitas de seguimiento como se lo haya indicado el mdico. Esto es importante. Comunquese con un mdico si: Sus sntomas empeoran. Sus sntomas no  mejoran despus de 2 meses de tratamiento. Aparecen nuevos sntomas. Presenta cambios en la visin o tiene [employer]  ojos, como enrojecimiento o picazn. Se siente deprimida. Pierde el apetito. Tiene dificultad para concentrarse. Resumen La roscea es una afeccin a largo plazo (crnica) que afecta la piel de la cara, lo que incluye las Malcom, la nariz, la frente y IT consultant. Cudese la piel como se lo haya indicado el [place] y aplquese los medicamentos de venta libre y los recetados solamente como se lo haya indicado el mdico. Comunquese con un mdico si los sntomas empeoran o si tiene algn cambio en la visin u otros problemas en los ojos, como enrojecimiento o picazn. Concurra a todas las visitas de seguimiento como se lo haya indicado el mdico. Esto es importante. Esta informacin no tiene Marine scientist el consejo del mdico. Asegresede hacerle al mdico cualquier pregunta que tenga. Document Revised: 07/11/2017 Document Reviewed: 07/11/2017 Elsevier Patient Education  [place].

## 2020-06-30 LAB — ANA,IFA RA DIAG PNL W/RFLX TIT/PATN
ANA Titer 1: NEGATIVE
Cyclic Citrullin Peptide Ab: 6 units (ref 0–19)
Rheumatoid fact SerPl-aCnc: <10 IU/mL (ref <14.0)

## 2020-06-30 LAB — ANTI-DNA ANTIBODY, DOUBLE-STRANDED: dsDNA Ab: <1 IU/mL (ref 0–9)

## 2020-07-22 ENCOUNTER — Ambulatory Visit: Payer: Self-pay | Attending: Nurse Practitioner

## 2020-07-22 ENCOUNTER — Other Ambulatory Visit: Payer: Self-pay

## 2020-09-04 ENCOUNTER — Other Ambulatory Visit: Payer: Self-pay

## 2020-09-04 MED ORDER — DULOXETINE HCL 30 MG PO CPEP: 30.0000 mg | ORAL_CAPSULE | Freq: Every day | ORAL | 11 refills | Status: DC

## 2020-09-04 MED ORDER — CYCLOBENZAPRINE HCL 10 MG PO TABS: 10.0000 mg | ORAL_TABLET | Freq: Every evening | ORAL | 5 refills | Status: DC | PRN

## 2020-09-04 MED ORDER — DICLOFENAC SODIUM 75 MG PO TBEC
75.0000 mg | DELAYED_RELEASE_TABLET | Freq: Two times a day (BID) | ORAL | 5 refills | Status: DC | PRN
Start: 2020-09-04 — End: 2021-02-24

## 2020-09-09 ENCOUNTER — Encounter: Payer: Self-pay | Admitting: Physical Medicine and Rehabilitation

## 2020-09-23 ENCOUNTER — Other Ambulatory Visit: Payer: Self-pay

## 2020-11-04 ENCOUNTER — Encounter: Payer: 59 | Attending: Physical Medicine and Rehabilitation | Admitting: Physical Medicine and Rehabilitation

## 2020-11-04 ENCOUNTER — Encounter: Payer: Self-pay | Admitting: Physical Medicine and Rehabilitation

## 2020-11-04 ENCOUNTER — Other Ambulatory Visit: Payer: Self-pay

## 2020-11-04 VITALS — BP 121/84 | HR 90 | Ht 63.0 in | Wt 241.0 lb

## 2020-11-04 DIAGNOSIS — G8929 Other chronic pain: Secondary | ICD-10-CM | POA: Insufficient documentation

## 2020-11-04 DIAGNOSIS — M546 Pain in thoracic spine: Secondary | ICD-10-CM | POA: Diagnosis present

## 2020-11-04 DIAGNOSIS — M7918 Myalgia, other site: Secondary | ICD-10-CM | POA: Insufficient documentation

## 2020-11-04 NOTE — Progress Notes (Signed)
Subjective:    Patient ID: Kristin [name], female    DOB: 05/28/1975, 45 y.o.   MRN: [id number]  HPI Patient is a 45 yr old female with hx of obesity on Phentermine and Chronic thoracic back pain here for f/u.   Still seeing Dr Tamala Julian- Thinks it's going good-  No bigger problems.   It's keeping her pain better controlled.   Still taking Duloxetine- scheduled- has prn Flexeril- not really taking.   Diclofenac- is taking that one. 2-3x/week- helps when takes it- completely resolves pain.   Wasn't taking the Flexeril/Diclofenac- resting helps. So hasn't really taken more Flexeril lately.   Has been sitting a lot for awhile- and back pain acts up and then shoulders.   Flexeril DOES help when muscles get tight.    Still having a chronic pain in back- comes around her ribs- not bad.      Pain Inventory Average Pain 3 Pain Right Now 0 My pain is intermittent and sharp  In the last 24 hours, has pain interfered with the following? General activity 0 Relation with others 0 Enjoyment of life 0 What TIME of day is your pain at its worst? daytime Sleep (in general) Fair  Pain is worse with: standing and some activites Pain improves with: rest and medication Relief from Meds: 10  Family History  Problem Relation Age of Onset   Breast cancer Neg Hx    Social History   Socioeconomic History   Marital status: Single    Spouse name: Not on file   Number of children: 3   Years of education: Not on file   Highest education level: 6th grade  Occupational History   Not on file  Tobacco Use   Smoking status: Never   Smokeless tobacco: Never  Vaping Use   Vaping Use: Never used  Substance and Sexual Activity   Alcohol use: Yes    Comment: occasionally    Drug use: No   Sexual activity: Yes  Other Topics Concern   Not on file  Social History Narrative   Not on file   Social Determinants of Health   Financial Resource Strain: Not on file  Food  Insecurity: Not on file  Transportation Needs: Not on file  Physical Activity: Not on file  Stress: Not on file  Social Connections: Not on file   Past Surgical History:  Procedure Laterality Date   etopic pregnancy     Past Surgical History:  Procedure Laterality Date   etopic pregnancy     Past Medical History:  Diagnosis Date   Ectopic pregnancy    Nephrolithiasis    There were no vitals taken for this visit.  Opioid Risk Score:   Fall Risk Score:  `1  Depression screen PHQ 2/9  Depression screen [facility] 2/9 06/22/2020 09/23/2019 07/10/2019 04/03/2019 10/22/2018 09/10/2018 05/09/2018  Decreased Interest 0 0 0 0 0 0 0  Down, Depressed, Hopeless 0 0 0 0 0 0 2  PHQ - 2 Score 0 0 0 0 0 0 2  Altered sleeping 0 0 - 0 0 0 1  Tired, decreased energy 0 0 - 0 0 0 0  Change in appetite 0 0 - 0 0 0 0  Feeling bad or failure about yourself  0 0 - 0 0 0 0  Trouble concentrating 0 0 - 0 0 0 0  Moving slowly or fidgety/restless 0 0 - 0 0 0 0  Suicidal thoughts 0 0 - 0 0  0 0  PHQ-9 Score 0 0 - 0 0 0 3  Some recent data might be hidden    Review of Systems  Musculoskeletal:  Positive for back pain.  All other systems reviewed and are negative.     Objective:   Physical Exam Awake, alert, appropriate, accompanied by interpretor, NAD Poor posture- shoulders flexed forward/rounded and breasts pulling forward.  TTP over L thoracic back- just below bra line and at bra line on L. Associated muscle tightness     Assessment & Plan:    Patient is a 45 yr old female with hx of obesity on Phentermine and Chronic thoracic back pain here for f/u.  Suggest taking Diclofenac/red pill 1x/day when needs it.  Can take 2 pills in a day, but 4 hours apart.  2. If pain not controlled, can then take Flexeril/Cyclobenzaprine-  if pain is still an issue.    3. Pain will not go away without strengthening- has to do DAILY- for 10 minutes max- go away unless we get muscles stronger-    4. Con't meds-  Diclofenac prn, Duloxetine and Flexeril prn   5. F/U in 3 months- on back pain. Might do trigger point injections at next visit.   I spent a total of 22 minutes on visit- discussing and demonstrating exercises to do- and educating on core strengthening.

## 2020-11-04 NOTE — Patient Instructions (Addendum)
   Patient is a 45 yr old female with hx of obesity on Phentermine and Chronic thoracic back pain here for f/u.  Suggest taking Diclofenac/red pill 1x/day when needs it.  Can take 2 pills in a day, but 4 hours apart.  2. If pain not controlled, can then take Flexeril/Cyclobenzaprine-  if pain is still an issue.    3. Pain will not go away without strengthening- has to do DAILY- for 10 minutes max- go away unless we get muscles stronger-    4. Con't meds- Diclofenac prn, Duloxetine and Flexeril prn   5. F/U in 3 months- on back pain.

## 2021-01-19 ENCOUNTER — Ambulatory Visit (HOSPITAL_COMMUNITY)
Admission: EM | Admit: 2021-01-19 | Discharge: 2021-01-19 | Disposition: A | Discharge status: Home / Self Care | Payer: Self-pay | Attending: Family Medicine | Admitting: Family Medicine

## 2021-01-19 ENCOUNTER — Encounter (HOSPITAL_COMMUNITY): Payer: Self-pay

## 2021-01-19 ENCOUNTER — Other Ambulatory Visit: Payer: Self-pay

## 2021-01-19 DIAGNOSIS — R1030 Lower abdominal pain, unspecified: Secondary | ICD-10-CM | POA: Insufficient documentation

## 2021-01-19 LAB — POCT URINALYSIS DIPSTICK, ED / UC
Bilirubin Urine: NEGATIVE
Glucose, UA: NEGATIVE mg/dL
Ketones, ur: NEGATIVE mg/dL
Nitrite: NEGATIVE
Protein, ur: >=300 mg/dL — AB
Specific Gravity, Urine: 1.02 (ref 1.005–1.030)
Urobilinogen, UA: 0.2 mg/dL (ref 0.0–1.0)
pH: 6 (ref 5.0–8.0)

## 2021-01-19 MED ORDER — CIPROFLOXACIN HCL 500 MG PO TABS: 500.0000 mg | ORAL_TABLET | Freq: Two times a day (BID) | ORAL | 0 refills | Status: AC

## 2021-01-19 NOTE — Discharge Instructions (Addendum)
Cipro 500 mg 1 twice daily for 7 days.  (( Cipro es el antibiotico. Tomelo 2 veces al dia por 7 dias.)  You can continue taking the AZO for your bladder pain(puede seguir tomando la AZO para el dolor ).  You can also take Tylenol or ibuprofen for the pain.( Puede tomar tambien tylenol o ibuprofen para el dolor)  Drink lots of fluids.([place] )

## 2021-01-19 NOTE — ED Provider Notes (Signed)
[facility]    CSN: [id number] Arrival date & time: 01/19/21  1900      History   Chief Complaint Chief Complaint  Patient presents with   Abdominal Pain    HPI Kristin [name] Kristin [name] is a 45 y.o. female.    Abdominal Pain Here for 2-3 days of lower abdominal pain, which has gradually worsened over time. Also is urinating frequently. No dysuria really. Hurts in her waist too. No f/c/n/v/d.  Has had kidney stones before.  Past Medical History:  Diagnosis Date   Ectopic pregnancy    Nephrolithiasis     Patient Active Problem List   Diagnosis Date Noted   Nonallopathic lesion of lumbar region 02/18/2020   Myofascial muscle pain 01/02/2019   Chronic left-sided thoracic back pain 01/02/2019   Chronic left-sided low back pain with left-sided sciatica 10/24/2018   Routine adult health maintenance 12/06/2016    Past Surgical History:  Procedure Laterality Date   etopic pregnancy      OB History     Gravida  4   Para      Term      Preterm      AB  1   Living  3      SAB      IAB      Ectopic  1   Multiple      Live Births  3            Home Medications    Prior to Admission medications   Medication Sig Start Date End Date Taking? Authorizing Provider  ciprofloxacin (CIPRO) 500 MG tablet Take 1 tablet (500 mg total) by mouth 2 (two) times daily for 7 days. 01/19/21 01/26/21 Yes Elloise Roark, Gwenlyn Perking, MD  cholecalciferol (VITAMIN D3) 25 MCG (1000 UNIT) tablet Take 1,000 Units by mouth daily.    [provider]  cyclobenzaprine (FLEXERIL) 10 MG tablet Take 1 tablet (10 mg total) by mouth at bedtime as needed for muscle spasms. 09/04/20   Lovorn, Jinny Blossom, MD  diclofenac (VOLTAREN) 75 MG EC tablet Take 1 tablet (75 mg total) by mouth 2 (two) times daily as needed. For back pain 09/04/20   Lovorn, Jinny Blossom, MD  DULoxetine (CYMBALTA) 30 MG capsule Take 1 capsule (30 mg total) by mouth at bedtime. For nerve pain Patient not taking:  Reported on 11/04/2020 09/04/20 09/04/21  Courtney Heys, MD  metroNIDAZOLE (METROGEL) 1 % gel Apply topically daily. Patient not taking: Reported on 11/04/2020 06/22/20   Charlott Rakes, MD    Family History Family History  Problem Relation Age of Onset   Breast cancer Neg Hx     Social History Social History   Tobacco Use   Smoking status: Never   Smokeless tobacco: Never  Vaping Use   Vaping Use: Never used  Substance Use Topics   Alcohol use: Yes    Comment: occasionally    Drug use: No     Allergies   Wellbutrin [bupropion]   Review of Systems Review of Systems  Gastrointestinal:  Positive for abdominal pain.    Physical Exam Triage Vital Signs ED Triage Vitals [01/19/21 1933]  Enc Vitals Group     BP (!) 141/80     Pulse Rate 96     Resp 16     Temp 99.4 F (37.4 C)     Temp Source Oral     SpO2 100 %     Weight      Height  Head Circumference      Peak Flow      Pain Score      Pain Loc      Pain Edu?      Excl. in [place]?    No data found.  Updated Vital Signs BP (!) 141/80 (BP Location: Left Arm)    Pulse 96    Temp 99.4 F (37.4 C) (Oral)    Resp 16    LMP 01/14/2020    SpO2 100%   Visual Acuity Right Eye Distance:   Left Eye Distance:   Bilateral Distance:    Right Eye Near:   Left Eye Near:    Bilateral Near:     Physical Exam Vitals reviewed.  Constitutional:      General: She is not in acute distress.    Appearance: She is not toxic-appearing or diaphoretic.  HENT:     Mouth/Throat:     Mouth: Mucous membranes are moist.  Cardiovascular:     Rate and Rhythm: Normal rate and regular rhythm.     Heart sounds: No murmur heard. Pulmonary:     Effort: Pulmonary effort is normal.     Breath sounds: Normal breath sounds.  Abdominal:     Palpations: Abdomen is soft. There is no mass.     Tenderness: There is no abdominal tenderness. There is no guarding.  Musculoskeletal:     Cervical back: Neck supple.  Lymphadenopathy:      Cervical: No cervical adenopathy.  Skin:    Capillary Refill: Capillary refill takes less than 2 seconds.     Coloration: Skin is not jaundiced or pale.  Neurological:     General: No focal deficit present.     Mental Status: She is alert and oriented to person, place, and time.  Psychiatric:        Behavior: Behavior normal.     UC Treatments / Results  Labs (all labs ordered are listed, but only abnormal results are displayed) Labs Reviewed  POCT URINALYSIS DIPSTICK, ED / UC - Abnormal; Notable for the following components:      Result Value   Hgb urine dipstick LARGE (*)    Protein, ur >=300 (*)    Leukocytes,Ua LARGE (*)    All other components within normal limits  URINE CULTURE    EKG   Radiology No results found.  Procedures Procedures (including critical care time)  Medications Ordered in UC Medications - No data to display  Initial Impression / Assessment and Plan / UC Course  I have reviewed the triage vital signs and the nursing notes.  Pertinent labs & imaging results that were available during my care of the patient were reviewed by me and considered in my medical decision making (see chart for details).     UA shows large amount of blood and WBC. Will treat for UTI and discussed her going to the ER or calling her PCP if not improving Final Clinical Impressions(s) / UC Diagnoses   Final diagnoses:  Lower abdominal pain     Discharge Instructions      Cipro 500 mg 1 twice daily for 7 days.  (( Cipro es el antibiotico. Tomelo 2 veces al dia por 7 dias.)  You can continue taking the AZO for your bladder pain(puede seguir tomando la AZO para el dolor ).  You can also take Tylenol or ibuprofen for the pain.( Puede tomar tambien tylenol o ibuprofen para el dolor)  Drink lots of fluids.([place] )  ED Prescriptions     Medication Sig Dispense Auth. Provider   ciprofloxacin (CIPRO) 500 MG tablet Take 1 tablet (500 mg total) by  mouth 2 (two) times daily for 7 days. 14 tablet Obdulia Steier, Gwenlyn Perking, MD      PDMP not reviewed this encounter.   Barrett Henle, MD 01/19/21 2001

## 2021-01-19 NOTE — ED Triage Notes (Signed)
Pt presents for abdominal pain with dysuria x 2 days. She is taking AZO for her symptoms.

## 2021-01-21 LAB — URINE CULTURE: Culture: >=100000 — AB

## 2021-02-03 ENCOUNTER — Encounter: Payer: Self-pay | Admitting: Physical Medicine and Rehabilitation

## 2021-02-03 ENCOUNTER — Other Ambulatory Visit: Payer: Self-pay

## 2021-02-03 ENCOUNTER — Encounter: Payer: 59 | Attending: Physical Medicine and Rehabilitation | Admitting: Physical Medicine and Rehabilitation

## 2021-02-03 VITALS — BP 129/82 | HR 90 | Ht 63.0 in | Wt 232.4 lb

## 2021-02-03 DIAGNOSIS — G8929 Other chronic pain: Secondary | ICD-10-CM

## 2021-02-03 DIAGNOSIS — M7918 Myalgia, other site: Secondary | ICD-10-CM

## 2021-02-03 DIAGNOSIS — M546 Pain in thoracic spine: Secondary | ICD-10-CM | POA: Diagnosis present

## 2021-02-03 NOTE — Progress Notes (Signed)
l  Subjective:    Patient ID: Kristin [name], female    DOB: [birth year], 46 y.o.   MRN: [id number]  HPI  Patient is a 46 yr old female with hx of obesity on Phentermine and Chronic thoracic back pain here for f/u.   Back pain doing better- Not having to take Diclofenac or Flexeril.  Is taking Duloxetine nightly- so appears ot be working.     Pain Inventory Average Pain 0 Pain Right Now 0 My pain is  No pain  In the last 24 hours, has pain interfered with the following? General activity 0 Relation with others 0 Enjoyment of life 0 What TIME of day is your pain at its worst? varies Sleep (in general) Fair  Pain is worse with:  No pain Pain improves with:  No pain Relief from Meds:  No pain  Family History  Problem Relation Age of Onset   Breast cancer Neg Hx    Social History   Socioeconomic History   Marital status: Single    Spouse name: Not on file   Number of children: 3   Years of education: Not on file   Highest education level: 6th grade  Occupational History   Not on file  Tobacco Use   Smoking status: Never   Smokeless tobacco: Never  Vaping Use   Vaping Use: Never used  Substance and Sexual Activity   Alcohol use: Yes    Comment: occasionally    Drug use: No   Sexual activity: Yes  Other Topics Concern   Not on file  Social History Narrative   Not on file   Social Determinants of Health   Financial Resource Strain: Not on file  Food Insecurity: Not on file  Transportation Needs: Not on file  Physical Activity: Not on file  Stress: Not on file  Social Connections: Not on file   Past Surgical History:  Procedure Laterality Date   etopic pregnancy     Past Surgical History:  Procedure Laterality Date   etopic pregnancy     Past Medical History:  Diagnosis Date   Ectopic pregnancy    Nephrolithiasis    BP 129/82    Pulse 90    Ht 5\' 3"  (1.6 m)    Wt 232 lb 6.4 oz (105.4 kg)    SpO2 98%    BMI 41.17 kg/m   Opioid Risk  Score:   Fall Risk Score:  `1  Depression screen PHQ 2/9  Depression screen [facility] 2/9 02/03/2021 11/04/2020 06/22/2020 09/23/2019 07/10/2019 04/03/2019 10/22/2018  Decreased Interest 0 0 0 0 0 0 0  Down, Depressed, Hopeless 0 0 0 0 0 0 0  PHQ - 2 Score 0 0 0 0 0 0 0  Altered sleeping - - 0 0 - 0 0  Tired, decreased energy - - 0 0 - 0 0  Change in appetite - - 0 0 - 0 0  Feeling bad or failure about yourself  - - 0 0 - 0 0  Trouble concentrating - - 0 0 - 0 0  Moving slowly or fidgety/restless - - 0 0 - 0 0  Suicidal thoughts - - 0 0 - 0 0  PHQ-9 Score - - 0 0 - 0 0  Some recent data might be hidden     Review of Systems  Constitutional: Negative.   HENT: Negative.    Eyes: Negative.   Respiratory: Negative.    Cardiovascular: Negative.   Gastrointestinal: Negative.  Endocrine: Negative.   Genitourinary: Negative.   Musculoskeletal: Negative.   Skin: Negative.   Allergic/Immunologic: Negative.   Neurological: Negative.   Hematological: Negative.   Psychiatric/Behavioral: Negative.        Objective:   Physical Exam  Awake, alert, appropriate, bright affect, NAD Not TTP across low back or thoracic spine.        Assessment & Plan:    Patient is a 46 yr old female with hx of obesity on Phentermine and Chronic thoracic back pain here for f/u.  Will con't Duloxetine daily- takes at night. Has enough refills for 6 more months.   2. Doesn't need trigger point injections, since pain doing so well.    3. Con't Flexeril and Diclofenac as needed- taking rarely.   4.  F/U as needed

## 2021-02-03 NOTE — Patient Instructions (Signed)
Patient is a 46 yr old female with hx of obesity on Phentermine and Chronic thoracic back pain here for f/u.  Will con't Duloxetine daily- takes at night. Has enough refills for 6 more months.   2. Doesn't need trigger point injections, since pain doing so well.    3. Con't Flexeril and Diclofenac as needed- taking rarely.   4.  F/U as needed

## 2021-02-15 IMAGING — CR DG LUMBAR SPINE COMPLETE 4+V
5 series · 5 of 5 positions shown · non-contrast
Comparison: October 12, 2015

CLINICAL DATA: Chronic lumbago

EXAM:
LUMBAR SPINE - COMPLETE 4+ VIEW

[l-spine obl (1 of 2)]
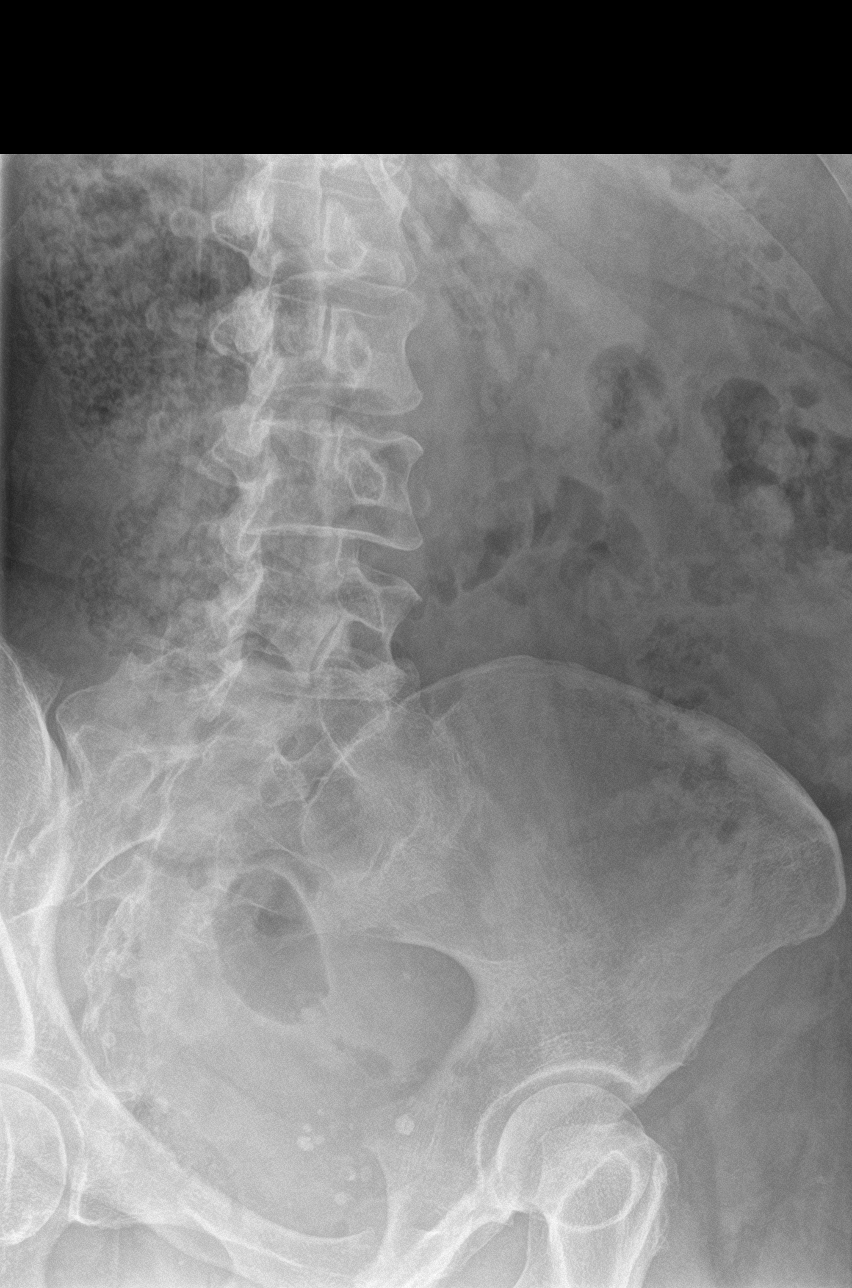

[l-spine lat]
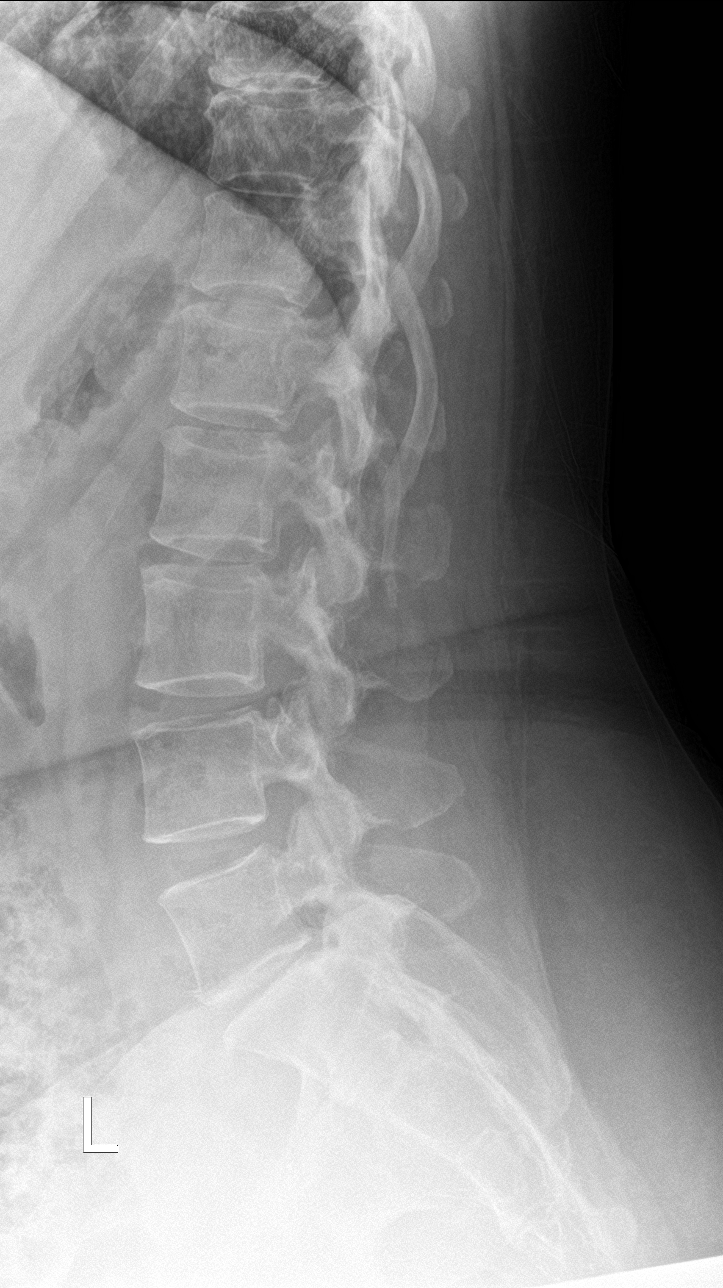

[l-spine spot]
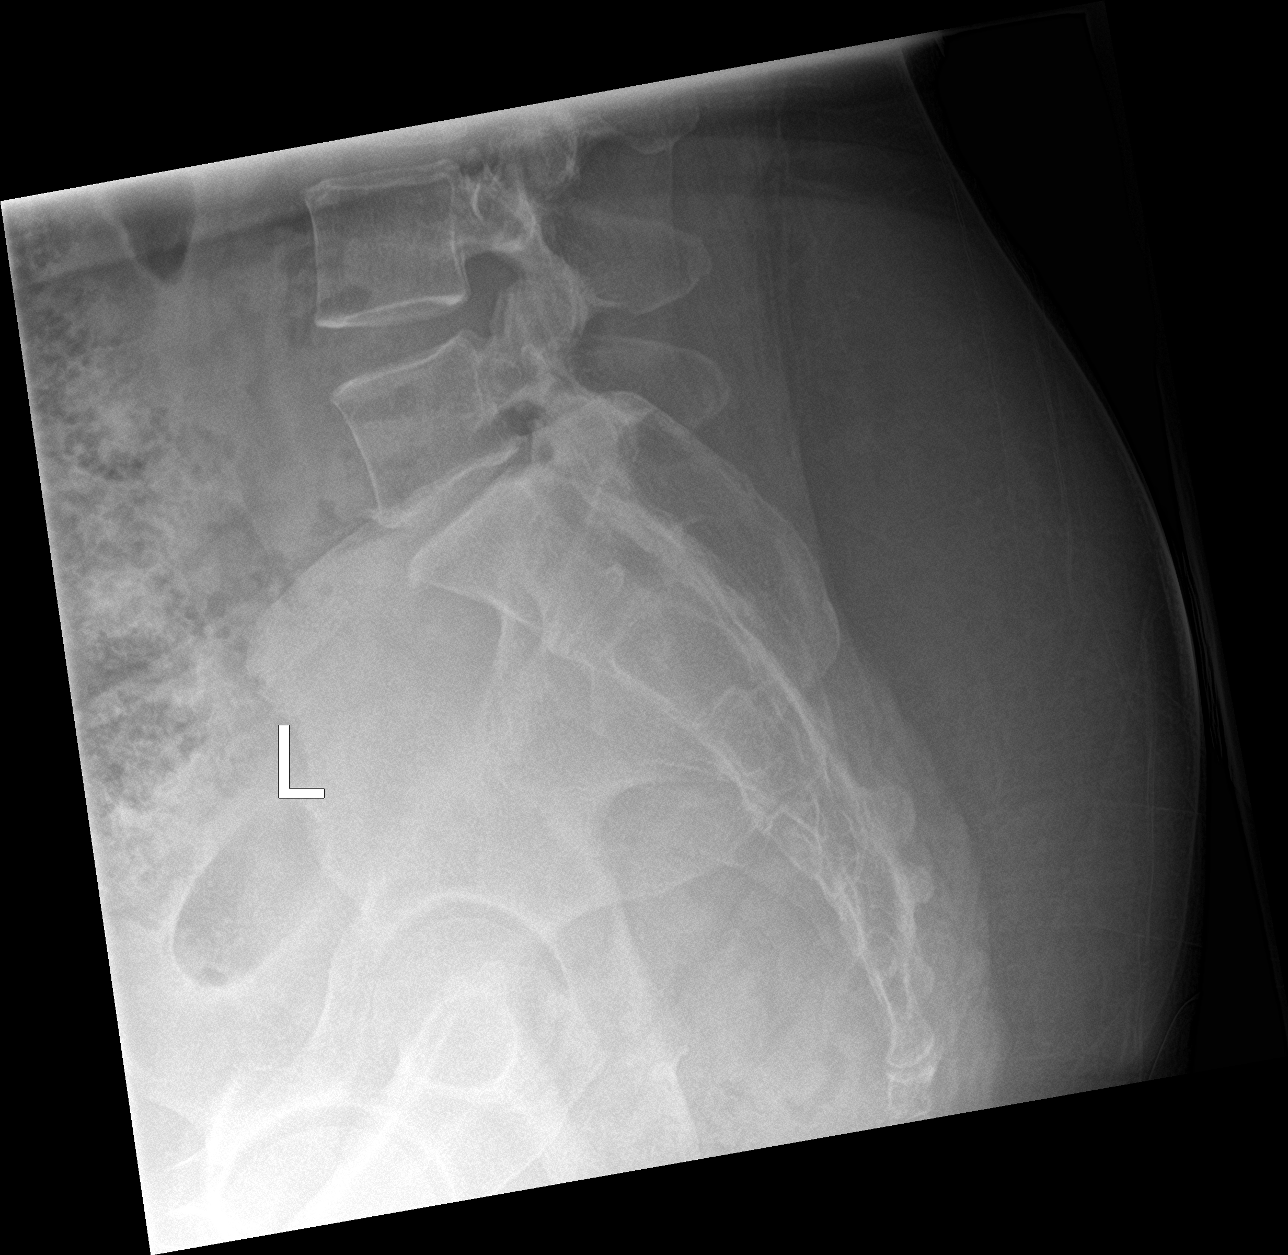

[l-spine ap]
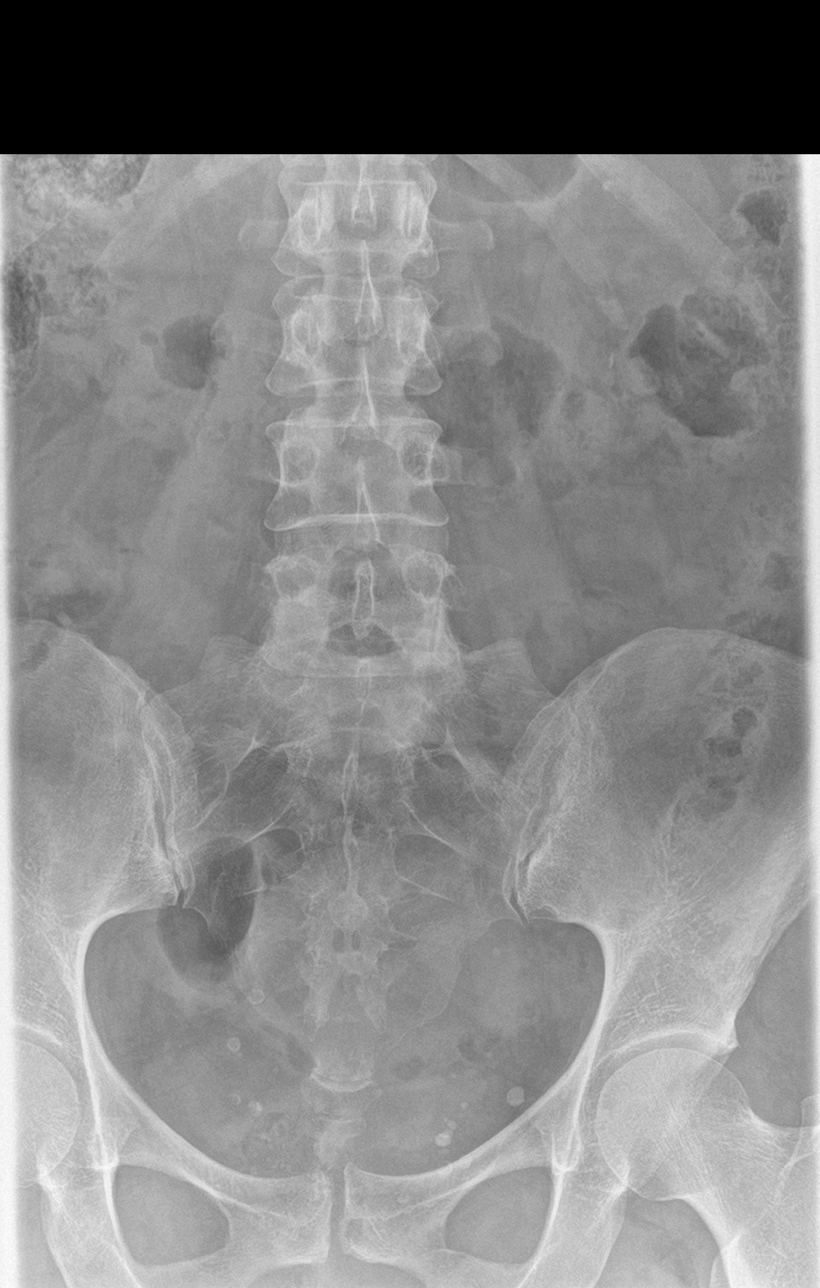

[l-spine obl (2 of 2)]
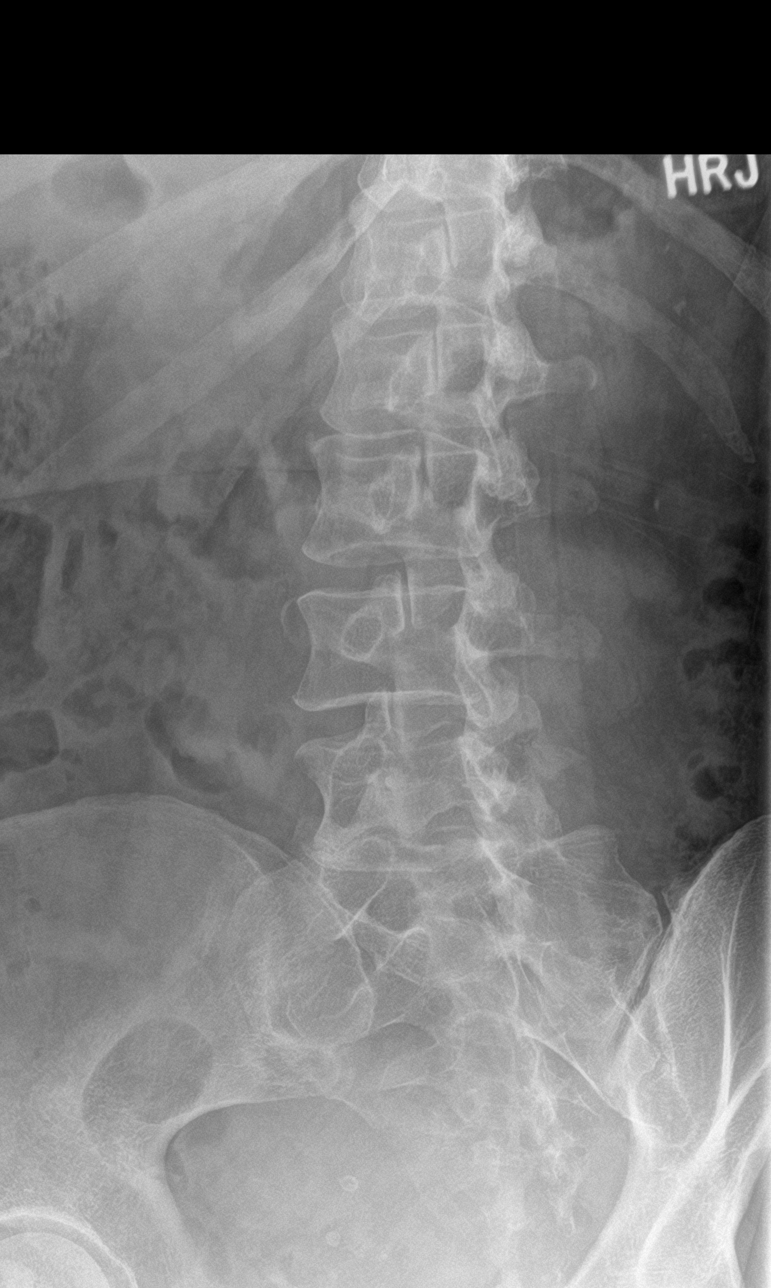

[5 of 5 positions shown; findings below may reference images not displayed]

FINDINGS: Frontal, lateral, spot lumbosacral lateral, and bilateral oblique
views were obtained. There are 4 non-rib-bearing lumbar type
vertebral bodies. There is no fracture or spondylolisthesis. There
is moderate disc space narrowing at L4-S1. Other disc spaces appear
unremarkable. There is facet arthropathy at L4-S1 bilaterally, more
notable on the right than on the left.
IMPRESSION: Four non-rib-bearing lumbar type vertebral bodies. Moderate disc
space narrowing at L4-S1. Other disc spaces appear unremarkable.
Facet osteoarthritic change at L4-S1 bilaterally, more notable on
the right than on the left. Other facets appear unremarkable. No
fracture or spondylolisthesis.

## 2021-02-16 ENCOUNTER — Telehealth: Payer: Self-pay | Admitting: Nurse Practitioner

## 2021-02-16 NOTE — Telephone Encounter (Signed)
Requested Prescriptions  Pending Prescriptions Disp Refills   DULoxetine (CYMBALTA) 30 MG capsule 30 capsule 11    Sig: Take 1 capsule (30 mg total) by mouth at bedtime. For nerve pain     Psychiatry: Antidepressants - SNRI - duloxetine Failed - 02/16/2021  2:56 PM      Failed - Valid encounter within last 6 months    Recent Outpatient Visits          7 months ago Seasonal allergies   [place], [place], MD   12 months ago Obesity (BMI 30-39.9)   [place], Vernia Buff, NP   1 year ago Prediabetes   [place], [place] W, NP   1 year ago Obesity (BMI 30-39.9)   [place], [place] W, NP   1 year ago Obesity (BMI 30-39.9)   [place], [place] W, NP             Passed - Cr in normal range and within 360 days    Creat  Date Value Ref Range Status  07/09/2014 0.68 0.50 - 1.10 mg/dL Final   Creatinine, Ser  Date Value Ref Range Status  02/26/2020 0.69 0.57 - 1.00 mg/dL Final         Passed - eGFR is 30 or above and within 360 days    GFR calc Af Amer  Date Value Ref Range Status  02/26/2020 121 >59 mL/min/1.73 Final    Comment:    **In accordance with recommendations from the NKF-ASN Task force,**   [facility] is in the process of updating its eGFR calculation to the   2021 CKD-EPI creatinine equation that estimates kidney function   without a race variable.    GFR calc non Af Amer  Date Value Ref Range Status  02/26/2020 105 >59 mL/min/1.73 Final         Passed - Completed PHQ-2 or PHQ-9 in the last 360 days      Passed - Last BP in normal range    BP Readings from Last 1 Encounters:  02/03/21 129/82

## 2021-02-16 NOTE — Telephone Encounter (Signed)
Medication Refill - Medication: DULoxetine (CYMBALTA) 30 MG capsule  Pt was prescribed by another provider needs pcp [name] to refill   Has the patient contacted their pharmacy? Yes.   (Agent: If no, request that the patient contact the pharmacy for the refill. If patient does not wish to contact the pharmacy document the reason why and proceed with request.) (Agent: If yes, when and what did the pharmacy advise?) Pt needs new RX from current pcp  Preferred Pharmacy (with phone number or street name):  [facility], [place] - [address] Phone:  [phone]  Fax:  [phone]     Has the patient been seen for an appointment in the last year OR does the patient have an upcoming appointment? Yes.    Agent: Please be advised that RX refills may take up to 3 business days. We ask that you follow-up with your pharmacy.

## 2021-02-17 ENCOUNTER — Other Ambulatory Visit: Payer: Self-pay | Admitting: Physical Medicine and Rehabilitation

## 2021-02-17 NOTE — Telephone Encounter (Addendum)
Patient was unable to come for an appointment due to no transportation. Will go to mobile clinic on Wednesday.

## 2021-02-17 NOTE — Telephone Encounter (Signed)
Patient has not been seen by PCP since 06/2020. Duloxetine is prescribed by Dr. Dagoberto Ligas. Pt will need to request the rx to be sent by Dr. Dagoberto Ligas.

## 2021-02-22 ENCOUNTER — Other Ambulatory Visit: Payer: Self-pay | Admitting: Physical Medicine and Rehabilitation

## 2021-02-22 ENCOUNTER — Encounter: Payer: Self-pay | Admitting: Physical Medicine and Rehabilitation

## 2021-02-24 ENCOUNTER — Telehealth: Payer: Self-pay | Admitting: Physical Medicine and Rehabilitation

## 2021-02-24 MED ORDER — DULOXETINE HCL 30 MG PO CPEP: 30.0000 mg | ORAL_CAPSULE | Freq: Every evening | ORAL | 1 refills | Status: DC

## 2021-02-24 MED ORDER — DICLOFENAC SODIUM 75 MG PO TBEC: 75.0000 mg | DELAYED_RELEASE_TABLET | Freq: Two times a day (BID) | ORAL | 1 refills | Status: DC | PRN

## 2021-02-24 NOTE — Telephone Encounter (Signed)
Patient's daughter notified of refills sent to pharmacy

## 2021-02-24 NOTE — Telephone Encounter (Signed)
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

## 2021-02-24 NOTE — Addendum Note (Signed)
Addended by: Courtney Heys on: 02/24/2021 10:21 AM   Modules accepted: Orders

## 2021-09-01 ENCOUNTER — Encounter: Payer: Self-pay | Admitting: Nurse Practitioner

## 2021-09-01 ENCOUNTER — Ambulatory Visit: Payer: 59 | Attending: Nurse Practitioner | Admitting: Nurse Practitioner

## 2021-09-01 VITALS — BP 136/83 | HR 91 | Temp 98.0°F | Ht 63.0 in | Wt 234.8 lb

## 2021-09-01 DIAGNOSIS — E785 Hyperlipidemia, unspecified: Secondary | ICD-10-CM

## 2021-09-01 DIAGNOSIS — R7303 Prediabetes: Secondary | ICD-10-CM | POA: Diagnosis not present

## 2021-09-01 DIAGNOSIS — M546 Pain in thoracic spine: Secondary | ICD-10-CM | POA: Diagnosis not present

## 2021-09-01 DIAGNOSIS — F325 Major depressive disorder, single episode, in full remission: Secondary | ICD-10-CM

## 2021-09-01 DIAGNOSIS — Z1231 Encounter for screening mammogram for malignant neoplasm of breast: Secondary | ICD-10-CM

## 2021-09-01 DIAGNOSIS — M7918 Myalgia, other site: Secondary | ICD-10-CM

## 2021-09-01 DIAGNOSIS — Z1159 Encounter for screening for other viral diseases: Secondary | ICD-10-CM

## 2021-09-01 DIAGNOSIS — G8929 Other chronic pain: Secondary | ICD-10-CM

## 2021-09-01 DIAGNOSIS — R7989 Other specified abnormal findings of blood chemistry: Secondary | ICD-10-CM

## 2021-09-01 MED ORDER — DULOXETINE HCL 30 MG PO CPEP: 30.0000 mg | ORAL_CAPSULE | Freq: Every evening | ORAL | 1 refills | Status: DC

## 2021-09-01 MED ORDER — DICLOFENAC SODIUM 75 MG PO TBEC: 75.0000 mg | DELAYED_RELEASE_TABLET | Freq: Two times a day (BID) | ORAL | 1 refills | Status: DC | PRN

## 2021-09-01 NOTE — Progress Notes (Signed)
Assessment & Plan:  [name] was seen today for medication refill.  Diagnoses and all orders for this visit:  Chronic left-sided thoracic back pain -     diclofenac (VOLTAREN) 75 MG EC tablet; Take 1 tablet (75 mg total) by mouth 2 (two) times daily as needed. For back pain -     DULoxetine (CYMBALTA) 30 MG capsule; Take 1 capsule (30 mg total) by mouth at bedtime. Work on losing weight to help reduce back pain. May alternate with heat and ice application for pain relief. May also alternate with acetaminophen and Ibuprofen as prescribed for back pain. Other alternatives include massage, acupuncture and water aerobics.  You must stay active and avoid a sedentary lifestyle.    Breast cancer screening by mammogram -     MS DIGITAL SCREENING TOMO BILATERAL; Future  Dyslipidemia, goal LDL below 100 -     Lipid panel INSTRUCTIONS: Work on a low fat, heart healthy diet and participate in regular aerobic exercise program by working out at least 150 minutes per week; 5 days a week-30 minutes per day. Avoid red meat/beef/steak,  fried foods. junk foods, sodas, sugary drinks, unhealthy snacking, alcohol and smoking.  Drink at least 80 oz of water per day and monitor your carbohydrate intake daily.    Prediabetes Stable with diet on ly at thistime. -     CMP14+EGFR -     Hemoglobin A1c  Abnormal CBC -     CBC with Differential  Myofascial muscle pain -     diclofenac (VOLTAREN) 75 MG EC tablet; Take 1 tablet (75 mg total) by mouth 2 (two) times daily as needed. For back pain -     DULoxetine (CYMBALTA) 30 MG capsule; Take 1 capsule (30 mg total) by mouth at bedtime.  Need for hepatitis C screening test -     HCV Ab w Reflex to Quant PCR  Major depressive disorder with single episode, in full remission ([place]) Stable Taking cymbalta daily as well.    Patient has been counseled on age-appropriate routine health concerns for screening and prevention. These are reviewed and up-to-date. Referrals  have been placed accordingly. Immunizations are up-to-date or declined.    Subjective:   Chief Complaint  Patient presents with   Medication Refill   Medication Refill Associated symptoms include myalgias. Pertinent negatives include no chest pain, coughing, fever, headaches, nausea or vomiting.   Kristin [name] [place] 46 y.o. female presents to office today for chronic left sided thoracic back pain and myofascial muscle pain.   She has a past medical history of Ectopic pregnancy, prediabetes, chronic back pain and Nephrolithiasis.   Pain is well controlled with diclofenac PO and Cymbalta. She is no longer seeing Dr. Dagoberto Ligas with PMR and would like to just continue on her current medications at this time.   Review of Systems  Constitutional:  Negative for fever, malaise/fatigue and weight loss.  HENT: Negative.  Negative for nosebleeds.   Eyes: Negative.  Negative for blurred vision, double vision and photophobia.  Respiratory: Negative.  Negative for cough and shortness of breath.   Cardiovascular: Negative.  Negative for chest pain, palpitations and leg swelling.  Gastrointestinal: Negative.  Negative for heartburn, nausea and vomiting.  Musculoskeletal:  Positive for back pain and myalgias.  Neurological: Negative.  Negative for dizziness, focal weakness, seizures and headaches.  Psychiatric/Behavioral:  Positive for depression. Negative for suicidal ideas. The patient is nervous/anxious.     Past Medical History:  Diagnosis Date  Ectopic pregnancy    Nephrolithiasis     Past Surgical History:  Procedure Laterality Date   etopic pregnancy      Family History  Problem Relation Age of Onset   Breast cancer Neg Hx     Social History Reviewed with no changes to be made today.   Outpatient Medications Prior to Visit  Medication Sig Dispense Refill   diclofenac (VOLTAREN) 75 MG EC tablet Take 1 tablet (75 mg total) by mouth 2 (two) times daily as needed. For back pain  180 tablet 1   DULoxetine (CYMBALTA) 30 MG capsule Take 1 capsule (30 mg total) by mouth at bedtime. 90 capsule 1   cholecalciferol (VITAMIN D3) 25 MCG (1000 UNIT) tablet Take 1,000 Units by mouth daily. (Patient not taking: Reported on 09/01/2021)     cyclobenzaprine (FLEXERIL) 10 MG tablet Take 1 tablet (10 mg total) by mouth at bedtime as needed for muscle spasms. (Patient not taking: Reported on 09/01/2021) 30 tablet 5   metroNIDAZOLE (METROGEL) 1 % gel Apply topically daily. (Patient not taking: Reported on 11/04/2020) 60 g 1   No facility-administered medications prior to visit.    Allergies  Allergen Reactions   Wellbutrin [Bupropion] Rash       Objective:    BP 136/83   Pulse 91   Temp 98 F (36.7 C) (Oral)   Ht '5\' 3"'  (1.6 m)   Wt 234 lb 12.8 oz (106.5 kg)   SpO2 98%   BMI 41.59 kg/m  Wt Readings from Last 3 Encounters:  09/01/21 234 lb 12.8 oz (106.5 kg)  02/03/21 232 lb 6.4 oz (105.4 kg)  11/04/20 241 lb (109.3 kg)    Physical Exam Vitals and nursing note reviewed.  Constitutional:      Appearance: She is well-developed.  HENT:     Head: Normocephalic and atraumatic.  Cardiovascular:     Rate and Rhythm: Normal rate and regular rhythm.     Heart sounds: Normal heart sounds. No murmur heard.    No friction rub. No gallop.  Pulmonary:     Effort: Pulmonary effort is normal. No tachypnea or respiratory distress.     Breath sounds: Normal breath sounds. No decreased breath sounds, wheezing, rhonchi or rales.  Chest:     Chest wall: No tenderness.  Abdominal:     General: Bowel sounds are normal.     Palpations: Abdomen is soft.  Musculoskeletal:        General: Normal range of motion.     Cervical back: Normal range of motion.  Skin:    General: Skin is warm and dry.  Neurological:     Mental Status: She is alert and oriented to person, place, and time.     Coordination: Coordination normal.  Psychiatric:        Behavior: Behavior normal. Behavior is  cooperative.        Thought Content: Thought content normal.        Judgment: Judgment normal.          Patient has been counseled extensively about nutrition and exercise as well as the importance of adherence with medications and regular follow-up. The patient was given clear instructions to go to ER or return to medical center if symptoms don't improve, worsen or new problems develop. The patient verbalized understanding.   Follow-up: Return for pap smear in november.   Gildardo Pounds, FNP-BC [facility] [place], [place]   09/01/2021, 3:31 PM

## 2021-09-02 LAB — HCV AB W REFLEX TO QUANT PCR: HCV Ab: NONREACTIVE

## 2021-09-02 LAB — CBC WITH DIFFERENTIAL/PLATELET
Basophils Absolute: 0 10*3/uL (ref 0.0–0.2)
Basos: 0 %
EOS (ABSOLUTE): 0.1 10*3/uL (ref 0.0–0.4)
Eos: 1 %
Hematocrit: 41.4 % (ref 34.0–46.6)
Hemoglobin: 14 g/dL (ref 11.1–15.9)
Immature Grans (Abs): 0 10*3/uL (ref 0.0–0.1)
Immature Granulocytes: 0 %
Lymphocytes Absolute: 2.6 10*3/uL (ref 0.7–3.1)
Lymphs: 31 %
MCH: 29.9 pg (ref 26.6–33.0)
MCHC: 33.8 g/dL (ref 31.5–35.7)
MCV: 89 fL (ref 79–97)
Monocytes Absolute: 0.5 10*3/uL (ref 0.1–0.9)
Monocytes: 6 %
Neutrophils Absolute: 5.2 10*3/uL (ref 1.4–7.0)
Neutrophils: 62 %
Platelets: 238 10*3/uL (ref 150–450)
RBC: 4.68 x10E6/uL (ref 3.77–5.28)
RDW: 14 % (ref 11.7–15.4)
WBC: 8.4 10*3/uL (ref 3.4–10.8)

## 2021-09-02 LAB — LIPID PANEL
Chol/HDL Ratio: 5.8 ratio — ABNORMAL HIGH (ref 0.0–4.4)
Cholesterol, Total: 286 mg/dL — ABNORMAL HIGH (ref 100–199)
HDL: 49 mg/dL (ref >39)
LDL Chol Calc (NIH): 206 mg/dL — ABNORMAL HIGH (ref 0–99)
Triglycerides: 163 mg/dL — ABNORMAL HIGH (ref 0–149)
VLDL Cholesterol Cal: 31 mg/dL (ref 5–40)

## 2021-09-02 LAB — CMP14+EGFR
ALT: 13 IU/L (ref 0–32)
AST: 16 IU/L (ref 0–40)
Albumin/Globulin Ratio: 1.4 (ref 1.2–2.2)
Albumin: 4.4 g/dL (ref 3.9–4.9)
Alkaline Phosphatase: 93 IU/L (ref 44–121)
BUN/Creatinine Ratio: 13 (ref 9–23)
BUN: 13 mg/dL (ref 6–24)
Bilirubin Total: 0.3 mg/dL (ref 0.0–1.2)
CO2: 26 mmol/L (ref 20–29)
Calcium: 9.8 mg/dL (ref 8.7–10.2)
Chloride: 100 mmol/L (ref 96–106)
Creatinine, Ser: 0.97 mg/dL (ref 0.57–1.00)
Globulin, Total: 3.2 g/dL (ref 1.5–4.5)
Glucose: 100 mg/dL — ABNORMAL HIGH (ref 70–99)
Potassium: 4.3 mmol/L (ref 3.5–5.2)
Sodium: 139 mmol/L (ref 134–144)
Total Protein: 7.6 g/dL (ref 6.0–8.5)
eGFR: 73 mL/min/{1.73_m2} (ref >59)

## 2021-09-02 LAB — HEMOGLOBIN A1C
Est. average glucose Bld gHb Est-mCnc: 134 mg/dL
Hgb A1c MFr Bld: 6.3 % — ABNORMAL HIGH (ref 4.8–5.6)

## 2021-09-02 LAB — HCV INTERPRETATION

## 2021-09-09 ENCOUNTER — Other Ambulatory Visit: Payer: Self-pay | Admitting: Nurse Practitioner

## 2021-09-09 DIAGNOSIS — E785 Hyperlipidemia, unspecified: Secondary | ICD-10-CM

## 2021-09-09 MED ORDER — ATORVASTATIN CALCIUM 20 MG PO TABS: 20.0000 mg | ORAL_TABLET | Freq: Every day | ORAL | 3 refills | Status: DC

## 2021-12-08 ENCOUNTER — Encounter: Payer: Self-pay | Admitting: Nurse Practitioner

## 2021-12-08 ENCOUNTER — Ambulatory Visit: Payer: Self-pay | Attending: Nurse Practitioner | Admitting: Nurse Practitioner

## 2021-12-08 VITALS — BP 125/82 | HR 80 | Temp 98.0°F | Ht 63.0 in | Wt 221.4 lb

## 2021-12-08 DIAGNOSIS — M7918 Myalgia, other site: Secondary | ICD-10-CM

## 2021-12-08 DIAGNOSIS — M546 Pain in thoracic spine: Secondary | ICD-10-CM

## 2021-12-08 DIAGNOSIS — G8929 Other chronic pain: Secondary | ICD-10-CM

## 2021-12-08 DIAGNOSIS — E785 Hyperlipidemia, unspecified: Secondary | ICD-10-CM

## 2021-12-08 DIAGNOSIS — Z23 Encounter for immunization: Secondary | ICD-10-CM

## 2021-12-08 MED ORDER — DULOXETINE HCL 30 MG PO CPEP: 30.0000 mg | ORAL_CAPSULE | Freq: Every evening | ORAL | 1 refills | Status: DC

## 2021-12-08 MED ORDER — ATORVASTATIN CALCIUM 20 MG PO TABS: 20.0000 mg | ORAL_TABLET | Freq: Every day | ORAL | 3 refills | Status: DC

## 2021-12-08 NOTE — Progress Notes (Signed)
Recheck of lipids

## 2021-12-08 NOTE — Progress Notes (Signed)
Assessment & Plan:  Kristin [name] was seen today for hyperlipidemia.  Diagnoses and all orders for this visit:  Need for influenza vaccination -     Flu Vaccine MDCK QUAD PF  Dyslipidemia, goal LDL below 100 -     Lipid panel INSTRUCTIONS: Work on a low fat, heart healthy diet and participate in regular aerobic exercise program by working out at least 150 minutes per week; 5 days a week-30 minutes per day. Avoid red meat/beef/steak,  fried foods. junk foods, sodas, sugary drinks, unhealthy snacking, alcohol and smoking.  Drink at least 80 oz of water per day and monitor your carbohydrate intake daily.      Patient has been counseled on age-appropriate routine health concerns for screening and prevention. These are reviewed and up-to-date. Referrals have been placed accordingly. Immunizations are up-to-date or declined.    Subjective:   Chief Complaint  Patient presents with   Hyperlipidemia   Hyperlipidemia Pertinent negatives include no chest pain, focal weakness, myalgias or shortness of breath.   Kristin [name] 46 y.o. female presents to office today for fasting lipid check. Initially her appt was for a PAP Smear however she states states today she was not prepared for a PAP smear  VRI was used to communicate directly with patient for the entire encounter including providing detailed patient instructions.     Wants to know why her lipids are high. We did discuss dietary modification and weight loss to help lower cholesterol levels. Does not require a statin at this time.  The 10-year ASCVD risk score (Arnett DK, et al., 2019) is: 1.9%   Values used to calculate the score:     Age: 68 years     Sex: Female     Is Non-Hispanic African American: No     Diabetic: No     Tobacco smoker: No     Systolic Blood Pressure: 622 mmHg     Is BP treated: No     HDL Cholesterol: 49 mg/dL     Total Cholesterol: 286 mg/dL  Framingham risk score 5%   Review of Systems   Constitutional:  Negative for fever, malaise/fatigue and weight loss.  HENT: Negative.  Negative for nosebleeds.   Eyes: Negative.  Negative for blurred vision, double vision and photophobia.  Respiratory: Negative.  Negative for cough and shortness of breath.   Cardiovascular: Negative.  Negative for chest pain, palpitations and leg swelling.  Gastrointestinal: Negative.  Negative for heartburn, nausea and vomiting.  Musculoskeletal: Negative.  Negative for myalgias.  Neurological: Negative.  Negative for dizziness, focal weakness, seizures and headaches.  Psychiatric/Behavioral: Negative.  Negative for suicidal ideas.     Past Medical History:  Diagnosis Date   Ectopic pregnancy    Nephrolithiasis     Past Surgical History:  Procedure Laterality Date   etopic pregnancy      Family History  Problem Relation Age of Onset   Breast cancer Neg Hx     Social History Reviewed with no changes to be made today.   Outpatient Medications Prior to Visit  Medication Sig Dispense Refill   atorvastatin (LIPITOR) 20 MG tablet Take 1 tablet (20 mg total) by mouth daily. 90 tablet 3   diclofenac (VOLTAREN) 75 MG EC tablet Take 1 tablet (75 mg total) by mouth 2 (two) times daily as needed. For back pain 180 tablet 1   DULoxetine (CYMBALTA) 30 MG capsule Take 1 capsule (30 mg total) by mouth at bedtime. 90 capsule 1  No facility-administered medications prior to visit.    Allergies  Allergen Reactions   Wellbutrin [Bupropion] Rash       Objective:    BP 125/82   Pulse 80   Temp 98 F (36.7 C) (Temporal)   Ht '5\' 3"'$  (1.6 m)   Wt 221 lb 6.4 oz (100.4 kg)   LMP 11/16/2021 (Exact Date)   SpO2 98%   BMI 39.22 kg/m  Wt Readings from Last 3 Encounters:  12/08/21 221 lb 6.4 oz (100.4 kg)  09/01/21 234 lb 12.8 oz (106.5 kg)  02/03/21 232 lb 6.4 oz (105.4 kg)    Physical Exam Vitals and nursing note reviewed.  Constitutional:      Appearance: She is well-developed.  HENT:      Head: Normocephalic and atraumatic.  Cardiovascular:     Rate and Rhythm: Normal rate and regular rhythm.     Heart sounds: Normal heart sounds. No murmur heard.    No friction rub. No gallop.  Pulmonary:     Effort: Pulmonary effort is normal. No tachypnea or respiratory distress.     Breath sounds: Normal breath sounds. No decreased breath sounds, wheezing, rhonchi or rales.  Chest:     Chest wall: No tenderness.  Abdominal:     General: Bowel sounds are normal.     Palpations: Abdomen is soft.  Musculoskeletal:        General: Normal range of motion.     Cervical back: Normal range of motion.  Skin:    General: Skin is warm and dry.  Neurological:     Mental Status: She is alert and oriented to person, place, and time.     Coordination: Coordination normal.  Psychiatric:        Behavior: Behavior normal. Behavior is cooperative.        Thought Content: Thought content normal.        Judgment: Judgment normal.        Patient has been counseled extensively about nutrition and exercise as well as the importance of adherence with medications and regular follow-up. The patient was given clear instructions to go to ER or return to medical center if symptoms don't improve, worsen or new problems develop. The patient verbalized understanding.   Follow-up: Return for PAP SMEAR.   Gildardo Pounds, FNP-BC [facility] and [facility] [place], [place]   12/08/2021, 11:29 AM

## 2021-12-09 LAB — LIPID PANEL
Chol/HDL Ratio: 4.3 ratio (ref 0.0–4.4)
Cholesterol, Total: 179 mg/dL (ref 100–199)
HDL: 42 mg/dL (ref >39)
LDL Chol Calc (NIH): 121 mg/dL — ABNORMAL HIGH (ref 0–99)
Triglycerides: 86 mg/dL (ref 0–149)
VLDL Cholesterol Cal: 16 mg/dL (ref 5–40)

## 2022-01-29 ENCOUNTER — Encounter (HOSPITAL_COMMUNITY): Payer: Self-pay

## 2022-01-29 ENCOUNTER — Ambulatory Visit (HOSPITAL_COMMUNITY)
Admission: EM | Admit: 2022-01-29 | Discharge: 2022-01-29 | Disposition: A | Discharge status: Home / Self Care | Payer: Self-pay | Attending: Physician Assistant | Admitting: Physician Assistant

## 2022-01-29 DIAGNOSIS — J00 Acute nasopharyngitis [common cold]: Secondary | ICD-10-CM

## 2022-01-29 DIAGNOSIS — H1031 Unspecified acute conjunctivitis, right eye: Secondary | ICD-10-CM

## 2022-01-29 MED ORDER — PROMETHAZINE-DM 6.25-15 MG/5ML PO SYRP: 5.0000 mL | ORAL_SOLUTION | Freq: Every evening | ORAL | 0 refills | Status: DC | PRN

## 2022-01-29 MED ORDER — MOXIFLOXACIN HCL 0.5 % OP SOLN: 1.0000 [drp] | Freq: Three times a day (TID) | OPHTHALMIC | 0 refills | Status: DC

## 2022-01-29 MED ORDER — BENZONATATE 100 MG PO CAPS: 100.0000 mg | ORAL_CAPSULE | Freq: Three times a day (TID) | ORAL | 0 refills | Status: AC | PRN

## 2022-01-29 NOTE — ED Provider Notes (Signed)
Kristin [name]   MRN: [id number] DOB: Aug 31, 1975  Subjective:   Kristin [name] is a 47 y.o. female presenting for generalized sore throat, bilateral ear pain, minimal sinus pressure, cough x 8 days.  Patient has been taking TheraFlu, tea, Robitussin with minimal relief.  She states she is not sleeping well because of the cough.  She states in the last few days she started to have redness to her right eye and is also having some discharge that is matting her eyes shut in the mornings.  Denies any chest pain or shortness of breath.  No fever or chills.  No recent sick contacts.  Due to language barrier, a virtual interpreter was present during the history-taking and subsequent discussion (and for part of the physical exam) with this patient.   No current facility-administered medications for this encounter.  Current Outpatient Medications:    benzonatate (TESSALON) 100 MG capsule, Take 1 capsule (100 mg total) by mouth 3 (three) times daily as needed for up to 20 days for cough., Disp: 30 capsule, Rfl: 0   moxifloxacin (VIGAMOX) 0.5 % ophthalmic solution, Place 1 drop into the right eye 3 (three) times daily., Disp: 3 mL, Rfl: 0   promethazine-dextromethorphan (PROMETHAZINE-DM) 6.25-15 MG/5ML syrup, Take 5 mLs by mouth at bedtime as needed for cough., Disp: 120 mL, Rfl: 0   atorvastatin (LIPITOR) 20 MG tablet, Take 1 tablet (20 mg total) by mouth daily., Disp: 90 tablet, Rfl: 3   diclofenac (VOLTAREN) 75 MG EC tablet, Take 1 tablet (75 mg total) by mouth 2 (two) times daily as needed. For back pain, Disp: 180 tablet, Rfl: 1   DULoxetine (CYMBALTA) 30 MG capsule, Take 1 capsule (30 mg total) by mouth at bedtime., Disp: 90 capsule, Rfl: 1   Allergies  Allergen Reactions   Wellbutrin [Bupropion] Rash    Past Medical History:  Diagnosis Date   Ectopic pregnancy    Nephrolithiasis      Past Surgical History:  Procedure Laterality Date   etopic pregnancy       Family History  Problem Relation Age of Onset   Breast cancer Neg Hx     Social History   Tobacco Use   Smoking status: Never   Smokeless tobacco: Never  Vaping Use   Vaping Use: Never used  Substance Use Topics   Alcohol use: Not Currently    Comment: occasionally    Drug use: No    ROS REFER TO HPI FOR PERTINENT POSITIVES AND NEGATIVES   Objective:   Vitals: BP 123/60 (BP Location: Left Arm)   Pulse 89   Temp 98.3 F (36.8 C) (Oral)   Resp 16   Ht '5\' 3"'$  (1.6 m)   Wt 220 lb (99.8 kg)   LMP 01/06/2022 (Exact Date)   SpO2 98%   BMI 38.97 kg/m   Physical Exam Vitals and nursing note reviewed.  Constitutional:      General: She is not in acute distress.    Appearance: Normal appearance. She is not ill-appearing.  HENT:     Head: Normocephalic.     Right Ear: Tympanic membrane, ear canal and external ear normal.     Left Ear: Tympanic membrane, ear canal and external ear normal.     Nose: Congestion present.     Mouth/Throat:     Mouth: Mucous membranes are moist.     Pharynx: No oropharyngeal exudate or posterior oropharyngeal erythema.  Eyes:     Extraocular Movements:  Extraocular movements intact.     Pupils: Pupils are equal, round, and reactive to light.     Comments: Some erythema noted right conjunctiva   Cardiovascular:     Rate and Rhythm: Normal rate and regular rhythm.     Pulses: Normal pulses.     Heart sounds: Normal heart sounds. No murmur heard. Pulmonary:     Effort: Pulmonary effort is normal. No respiratory distress.     Breath sounds: Normal breath sounds. No wheezing.  Musculoskeletal:     Cervical back: Normal range of motion.  Skin:    General: Skin is warm.  Neurological:     Mental Status: She is alert and oriented to person, place, and time.  Psychiatric:        Mood and Affect: Mood normal.        Behavior: Behavior normal.     No results found for this or any previous visit (from the past 24 hour(s)).  Assessment  and Plan :   PDMP not reviewed this encounter.  1. Acute nasopharyngitis (common cold)   2. Acute bacterial conjunctivitis of right eye    Reassured patient likely viral, common cold.  Treat conservatively at this time.  Prescription given for Tessalon Perles to take during the daytime.  Promethazine DM cough syrup for bedtime.  Also gave prescription for Vigamox eyedrops to use as directed on her right eye.  She can use warm compresses.  Rest, push fluids.  Recheck as needed.    AllwardtRanda Evens, PA-C 01/29/22 1943

## 2022-01-29 NOTE — ED Triage Notes (Signed)
Chief Complaint: sore throat, ear pain, and facial pressure . Cough as well.   Onset: 8 days   Prescriptions or OTC medications tried: Yes- Theraflu tea, Robitussin     with no relief  Sick exposure: No

## 2022-01-29 NOTE — Discharge Instructions (Signed)
Good to meet you.  Please take the medications as directed.  Try to rest and push fluids.  Recheck with your primary care if no improvement.

## 2022-02-21 ENCOUNTER — Ambulatory Visit: Payer: Self-pay | Attending: Family Medicine

## 2022-03-16 ENCOUNTER — Ambulatory Visit: Payer: Self-pay | Attending: Nurse Practitioner | Admitting: Nurse Practitioner

## 2022-03-16 ENCOUNTER — Encounter: Payer: Self-pay | Admitting: Nurse Practitioner

## 2022-03-16 VITALS — BP 131/84 | HR 88 | Ht 63.0 in | Wt 223.0 lb

## 2022-03-16 DIAGNOSIS — R7303 Prediabetes: Secondary | ICD-10-CM

## 2022-03-16 DIAGNOSIS — E7800 Pure hypercholesterolemia, unspecified: Secondary | ICD-10-CM

## 2022-03-16 DIAGNOSIS — E559 Vitamin D deficiency, unspecified: Secondary | ICD-10-CM

## 2022-03-16 DIAGNOSIS — Z1211 Encounter for screening for malignant neoplasm of colon: Secondary | ICD-10-CM

## 2022-03-16 DIAGNOSIS — L659 Nonscarring hair loss, unspecified: Secondary | ICD-10-CM

## 2022-03-16 DIAGNOSIS — D72829 Elevated white blood cell count, unspecified: Secondary | ICD-10-CM

## 2022-03-16 LAB — POCT GLYCOSYLATED HEMOGLOBIN (HGB A1C): HbA1c, POC (prediabetic range): 6.1 % (ref 5.7–6.4)

## 2022-03-16 NOTE — Progress Notes (Addendum)
Assessment & Plan:  Kristin [name] was seen today for prediabetes.  Diagnoses and all orders for this visit:  Prediabetes -     POCT glycosylated hemoglobin (Hb A1C) -     CMP14+EGFR  Leukocytosis, unspecified type -     CBC with Differential  Hypercholesterolemia -     Lipid panel -     Thyroid Panel With TSH  Morbid obesity due to excess calories (HCC) -     Amb ref to Medical Nutrition Therapy-MNT -     Thyroid Panel With TSH  Colon cancer screening -     Fecal occult blood, imunochemical([facility])  Hair thinning -     Thyroid Panel With TSH  Vitamin D deficiency -     VITAMIN D 25 Hydroxy (Vit-D Deficiency, Fractures)    Patient has been counseled on age-appropriate routine health concerns for screening and prevention. These are reviewed and up-to-date. Referrals have been placed accordingly. Immunizations are up-to-date or declined.    Subjective:   Chief Complaint  Patient presents with   Prediabetes   HPI Kristin [name] 47 y.o. female presents to office today for follow up to prediabetes.    VRI was used to communicate directly with patient for the entire encounter including providing detailed patient instructions.  Prediabetes Well controlled at this time Lab Results  Component Value Date   HGBA1C 6.1 03/16/2022     She had questions today regarding cortisol, difficulty losing weight, vitamin K, and GLP-1's and menopausal symptoms including hair thinning and facial hair.  Will refer her to a nutritionist/dietitian in regard to weight loss, nutrition and dietary intake. Wants to know if she can see a chiropractor for chronic back and neck pain Review of Systems  Constitutional:  Negative for fever, malaise/fatigue and weight loss.  HENT: Negative.  Negative for nosebleeds.   Eyes: Negative.  Negative for blurred vision, double vision and photophobia.  Respiratory: Negative.  Negative for cough and shortness of breath.   Cardiovascular:  Negative.  Negative for chest pain, palpitations and leg swelling.  Gastrointestinal: Negative.  Negative for heartburn, nausea and vomiting.  Musculoskeletal: Negative.  Negative for myalgias.  Neurological: Negative.  Negative for dizziness, focal weakness, seizures and headaches.  Psychiatric/Behavioral: Negative.  Negative for suicidal ideas.     Past Medical History:  Diagnosis Date   Ectopic pregnancy    Nephrolithiasis     Past Surgical History:  Procedure Laterality Date   etopic pregnancy      Family History  Problem Relation Age of Onset   Breast cancer Neg Hx     Social History Reviewed with no changes to be made today.   Outpatient Medications Prior to Visit  Medication Sig Dispense Refill   atorvastatin (LIPITOR) 20 MG tablet Take 1 tablet (20 mg total) by mouth daily. 90 tablet 3   diclofenac (VOLTAREN) 75 MG EC tablet Take 1 tablet (75 mg total) by mouth 2 (two) times daily as needed. For back pain 180 tablet 1   DULoxetine (CYMBALTA) 30 MG capsule Take 1 capsule (30 mg total) by mouth at bedtime. 90 capsule 1   moxifloxacin (VIGAMOX) 0.5 % ophthalmic solution Place 1 drop into the right eye 3 (three) times daily. 3 mL 0   promethazine-dextromethorphan (PROMETHAZINE-DM) 6.25-15 MG/5ML syrup Take 5 mLs by mouth at bedtime as needed for cough. 120 mL 0   No facility-administered medications prior to visit.    Allergies  Allergen Reactions   Wellbutrin [Bupropion] Rash  Objective:    BP 131/84   Pulse 88   Ht '5\' 3"'$  (1.6 m)   Wt 223 lb (101.2 kg)   LMP 02/23/2022 (Exact Date)   SpO2 98%   BMI 39.50 kg/m  Wt Readings from Last 3 Encounters:  03/16/22 223 lb (101.2 kg)  01/29/22 220 lb (99.8 kg)  12/08/21 221 lb 6.4 oz (100.4 kg)    Physical Exam Vitals and nursing note reviewed.  Constitutional:      Appearance: She is well-developed.  HENT:     Head: Normocephalic and atraumatic.  Cardiovascular:     Rate and Rhythm: Normal rate and  regular rhythm.     Heart sounds: Normal heart sounds. No murmur heard.    No friction rub. No gallop.  Pulmonary:     Effort: Pulmonary effort is normal. No tachypnea or respiratory distress.     Breath sounds: Normal breath sounds. No decreased breath sounds, wheezing, rhonchi or rales.  Chest:     Chest wall: No tenderness.  Abdominal:     General: Bowel sounds are normal.     Palpations: Abdomen is soft.  Musculoskeletal:        General: Normal range of motion.     Cervical back: Normal range of motion.  Skin:    General: Skin is warm and dry.  Neurological:     Mental Status: She is alert and oriented to person, place, and time.     Coordination: Coordination normal.  Psychiatric:        Behavior: Behavior normal. Behavior is cooperative.        Thought Content: Thought content normal.        Judgment: Judgment normal.          Patient has been counseled extensively about nutrition and exercise as well as the importance of adherence with medications and regular follow-up. The patient was given clear instructions to go to ER or return to medical center if symptoms don't improve, worsen or new problems develop. The patient verbalized understanding.   Follow-up: Return in about 6 months (around 09/16/2022).   Gildardo Pounds, FNP-BC [facility] and [facility] [place], [place]   03/16/2022, 1:35 PM

## 2022-03-17 LAB — CBC WITH DIFFERENTIAL/PLATELET
Basophils Absolute: 0 10*3/uL (ref 0.0–0.2)
Basos: 0 %
EOS (ABSOLUTE): 0.1 10*3/uL (ref 0.0–0.4)
Eos: 2 %
Hematocrit: 42.7 % (ref 34.0–46.6)
Hemoglobin: 14.1 g/dL (ref 11.1–15.9)
Immature Grans (Abs): 0 10*3/uL (ref 0.0–0.1)
Immature Granulocytes: 0 %
Lymphocytes Absolute: 2.9 10*3/uL (ref 0.7–3.1)
Lymphs: 36 %
MCH: 30.1 pg (ref 26.6–33.0)
MCHC: 33 g/dL (ref 31.5–35.7)
MCV: 91 fL (ref 79–97)
Monocytes Absolute: 0.5 10*3/uL (ref 0.1–0.9)
Monocytes: 6 %
Neutrophils Absolute: 4.5 10*3/uL (ref 1.4–7.0)
Neutrophils: 56 %
Platelets: 222 10*3/uL (ref 150–450)
RBC: 4.69 x10E6/uL (ref 3.77–5.28)
RDW: 13.6 % (ref 11.7–15.4)
WBC: 8.1 10*3/uL (ref 3.4–10.8)

## 2022-03-17 LAB — LIPID PANEL
Chol/HDL Ratio: 4 ratio (ref 0.0–4.4)
Cholesterol, Total: 214 mg/dL — ABNORMAL HIGH (ref 100–199)
HDL: 53 mg/dL (ref >39)
LDL Chol Calc (NIH): 146 mg/dL — ABNORMAL HIGH (ref 0–99)
Triglycerides: 84 mg/dL (ref 0–149)
VLDL Cholesterol Cal: 15 mg/dL (ref 5–40)

## 2022-03-17 LAB — CMP14+EGFR
ALT: 13 IU/L (ref 0–32)
AST: 18 IU/L (ref 0–40)
Albumin/Globulin Ratio: 1.6 (ref 1.2–2.2)
Albumin: 4.6 g/dL (ref 3.9–4.9)
Alkaline Phosphatase: 86 IU/L (ref 44–121)
BUN/Creatinine Ratio: 22 (ref 9–23)
BUN: 14 mg/dL (ref 6–24)
Bilirubin Total: 0.4 mg/dL (ref 0.0–1.2)
CO2: 21 mmol/L (ref 20–29)
Calcium: 9.5 mg/dL (ref 8.7–10.2)
Chloride: 100 mmol/L (ref 96–106)
Creatinine, Ser: 0.64 mg/dL (ref 0.57–1.00)
Globulin, Total: 2.9 g/dL (ref 1.5–4.5)
Glucose: 99 mg/dL (ref 70–99)
Potassium: 4.2 mmol/L (ref 3.5–5.2)
Sodium: 138 mmol/L (ref 134–144)
Total Protein: 7.5 g/dL (ref 6.0–8.5)
eGFR: 109 mL/min/{1.73_m2} (ref >59)

## 2022-03-17 LAB — THYROID PANEL WITH TSH
Free Thyroxine Index: 2.1 (ref 1.2–4.9)
T3 Uptake Ratio: 24 % (ref 24–39)
T4, Total: 8.6 ug/dL (ref 4.5–12.0)
TSH: 2.49 u[IU]/mL (ref 0.450–4.500)

## 2022-03-17 LAB — VITAMIN D 25 HYDROXY (VIT D DEFICIENCY, FRACTURES): Vit D, 25-Hydroxy: 39.1 ng/mL (ref 30.0–100.0)

## 2022-08-10 ENCOUNTER — Telehealth: Payer: Self-pay

## 2022-08-10 NOTE — Telephone Encounter (Signed)
Patient identified by name and date of birth.  Appointment scheduled.  

## 2022-08-10 NOTE — Telephone Encounter (Signed)
Copied from CRM [id number]. Topic: Referral - Request for Referral >> Aug 10, 2022 11:05 AM Dondra Prader E wrote: Pt called reporting that she wants to be seen for an ankle injury. [facility]" and if not there she does not want to go anywhere outside of [place].

## 2022-08-25 ENCOUNTER — Telehealth: Payer: Self-pay

## 2022-08-25 ENCOUNTER — Ambulatory Visit: Payer: Self-pay | Attending: Physician Assistant | Admitting: Physician Assistant

## 2022-08-25 ENCOUNTER — Encounter: Payer: Self-pay | Admitting: Physician Assistant

## 2022-08-25 VITALS — BP 125/86 | HR 90 | Wt 229.6 lb

## 2022-08-25 DIAGNOSIS — Z603 Acculturation difficulty: Secondary | ICD-10-CM

## 2022-08-25 DIAGNOSIS — M533 Sacrococcygeal disorders, not elsewhere classified: Secondary | ICD-10-CM

## 2022-08-25 DIAGNOSIS — M25571 Pain in right ankle and joints of right foot: Secondary | ICD-10-CM

## 2022-08-25 DIAGNOSIS — Z758 Other problems related to medical facilities and other health care: Secondary | ICD-10-CM

## 2022-08-25 DIAGNOSIS — M25551 Pain in right hip: Secondary | ICD-10-CM

## 2022-08-25 NOTE — Patient Instructions (Signed)
Disfuncin de Photographer Joint Dysfunction  La disfuncin en la articulacin sacroilaca es un trastorno que causa inflamacin en uno o ambos lados de la articulacin sacroilaca (SI). La articulacin SI es la articulacin [employer] de la pelvis llamados sacro e ilion. El sacro es el hueso que se encuentra en la base de la columna vertebral. El ilion es el hueso que forma la cadera. Esta afeccin causa un fuerte dolor o dolor urente en la parte inferior de la columna. En algunos casos, el dolor tambin puede extenderse a una o ambas nalgas, la cadera o los muslos. Cules son las causas? Esta afeccin puede ser causada por lo siguiente: Embarazo. Durante el [place], las articulaciones SI reciben una presin adicional porque la pelvis se ensancha. Una lesin, por ejemplo: Lesiones por accidentes automovilsticos. Lesiones relacionadas con deportes. Lesiones relacionadas con Kathie Dike. Tener una pierna ms corta que la [place]. Trastornos que [employer], como los siguientes: Artritis reumatoide. Gota. Artritis psorisica. Infeccin en las articulaciones (artritis sptica). A veces, se desconoce la causa de la disfuncin en la articulacin SI. Cules son los signos o sntomas? Los sntomas de esta afeccin incluyen: Molestias o dolor urente en la parte inferior de la espalda. El dolor tambin puede extenderse a otras reas, como las siguientes: Las nalgas. La ingle. Los muslos. Espasmos musculares en las reas doloridas o alrededor de ellas. Aumento del dolor al estar de pie, caminar, correr, subir escaleras, agacharse o levantarse. Cmo se diagnostica? Esta afeccin se diagnostica mediante un examen fsico y sus antecedentes mdicos. Durante el examen, el mdico puede moverle una o ambas piernas a diferentes posiciones para constatar si le duele. Pueden realizarse diversos estudios para confirmar el diagnstico, entre ellos: Pruebas de  diagnstico por imgenes para buscar otras causas del dolor. Estas pruebas pueden incluir lo siguiente: Resonancia magntica (RM). Exploracin por tomografa computarizada (TC). Gammagrafa sea. Inyeccin de diagnstico. Con una aguja, se inyecta un anestsico en la articulacin SI. Si despus de Risk manager se detiene o se interrumpe de forma temporal, esto puede indicar que el problema es una disfuncin en la articulacin SI. Cmo se trata? El tratamiento depende de la causa y de la gravedad de Astronomer. Las opciones de tratamiento pueden ser no invasivas, y pueden incluir: Contractor hielo o calor en la zona lumbar despus de una lesin. Esto puede ayudar a Glass blower/designer y los espasmos musculares. Medicamentos para Engineer, materials o la inflamacin, o para [employer]. Uso de un soporte en la espalda (soporte sacroilaco) como ayuda para dar sostn a la articulacin mientras la espalda mejora. Fisioterapia para aumentar la fuerza muscular alrededor de la articulacin y la flexibilidad de Nurse, learning disability. Esta tambin [employer] dirigida a aprender posturas corporales adecuadas y formas de moverse para Technical sales engineer sobrecarga en la articulacin. Manipulacin directa de la articulacin SI. Uso de un dispositivo que emite estimulacin elctrica para ayudar a Environmental consultant. Otros tratamientos pueden incluir los siguientes: Inyecciones de medicamentos con corticoesteroides en la articulacin para Engineer, materials y reducir la hinchazn. Ablacin por radiofrecuencia. Este tratamiento [place] calor para [employer] nervios que transmiten mensajes de dolor desde la articulacin. Ciruga para Scientific laboratory technician tornillos y placas que limitan o evitan el movimiento de Nurse, learning disability. Esto es poco frecuente. Siga estas instrucciones en su casa: Medicamentos Use los medicamentos de venta libre y los recetados solamente como se lo haya indicado el mdico. Pregntele al  mdico  si el medicamento recetado: Hace necesario que evite conducir o usar [place]. Puede causarle estreimiento. Es posible que tenga que tomar estas medidas para prevenir o tratar el estreimiento: Product manager suficiente lquido como para Pharmacologist la orina de color amarillo plido. Usar medicamentos recetados o de Sales promotion account executive. Consumir alimentos ricos en fibra, como frijoles, cereales integrales, y frutas y verduras frescas. Limitar el consumo de alimentos ricos en grasa y azcares procesados, como los alimentos fritos o dulces. Si tiene un dispositivo ortopdico: selo como se lo haya indicado el mdico. Quteselo solamente como se lo haya indicado el mdico. Mantenga limpio el dispositivo ortopdico. Si el dispositivo ortopdico no es impermeable: No deje que se moje. Cbralo con un envoltorio hermtico cuando tome un bao de inmersin o una ducha. Control del dolor, la rigidez y la hinchazn     El hielo puede aliviar el dolor y reducir la hinchazn. El calor puede aliviar la tensin muscular o los espasmos musculares. Pregntele al mdico si debe usar hielo o calor. Si se lo indican, aplique hielo sobre la zona afectada: Si [place] un dispositivo ortopdico desmontable, quteselo segn las indicaciones del mdico. Ponga el hielo en una bolsa plstica. Coloque una toalla entre la piel y Copy. Aplique el hielo durante 20 minutos, 2 o 3 veces por da. Retire el hielo si la piel se pone de color rojo brillante. Esto es [employer]. Si no puede sentir dolor, calor o fro, tiene un mayor riesgo de que se dae la zona. Si se lo indican, aplique calor en la zona afectada con la frecuencia que le haya indicado el mdico. Use la fuente de calor que el mdico le recomiende, como una compresa de calor hmedo o una almohadilla trmica. Coloque una toalla entre la piel y la fuente de Airline pilot. Aplique calor durante 20 a 30 minutos. Retire la fuente de calor si la piel se pone de color rojo  brillante. Esto es especialmente importante si no puede sentir dolor, calor o fro. Puede correr un mayor riesgo de sufrir quemaduras. Instrucciones generales Descansar todo lo que sea necesario. Retome sus actividades normales segn lo indicado por el mdico. Pregntele al mdico qu actividades son seguras para usted. Haga los ejercicios como se lo haya indicado el mdico o el fisioterapeuta. Cumpla con todas las visitas de seguimiento. Esto es importante. Comunquese con un mdico si: El dolor no se alivia con los [employer]. Tiene fiebre. El dolor [place]. Solicite ayuda de inmediato si: Siente debilidad, adormecimiento u hormigueo en los pies o en las piernas. Pierde el control de la funcin de la vejiga o del intestino. Resumen La disfuncin en la articulacin sacroilaca (SI) es un trastorno que causa inflamacin en uno o ambos lados de dicha articulacin. Esta afeccin causa un fuerte dolor o dolor urente en la parte inferior de la columna. En algunos casos, el dolor tambin puede extenderse a una o ambas nalgas, la cadera o los muslos. El tratamiento depende de la causa y de la gravedad de Astronomer. Puede incluir medicamentos para reducir Chief Technology Officer y la hinchazn, o para relajar los msculos. Esta informacin no tiene Theme park manager el consejo del mdico. Asegrese de hacerle al mdico cualquier pregunta que tenga. Document Revised: 06/20/2019 Document Reviewed: 06/20/2019 Elsevier Patient Education  2024 [employer].

## 2022-08-25 NOTE — Progress Notes (Signed)
Patient ID: Kristin [name], female   DOB: 1975-04-04, 47 y.o.   MRN: [id number]   Kristin [name], is a 47 y.o. female  [id number]  [id number]  DOB - [birth year]  Chief Complaint  Patient presents with   Ankle Injury       Subjective:   Kristin [name] is a 47 y.o. female here today for ongoing and worsening lower back on the R side, R hip pain, and R ankle pain s/p twisting her ankle about 3-4 weeks ago.  Voltaren has been helping with pain.  She did not purse doctor's visit at the time of the injury bc the pain was mild and she assumed it would go away after a few days.  Her hip and lower back bother her esp when she lays on her R side or has to be still for a while. No shooting pains or paresthesias.  She has not had weakness.  She is renewing financial assistance today.  No problems updated.  ALLERGIES: Allergies  Allergen Reactions   Wellbutrin [Bupropion] Rash    PAST MEDICAL HISTORY: Past Medical History:  Diagnosis Date   Ectopic pregnancy    Nephrolithiasis     MEDICATIONS AT HOME: Prior to Admission medications   Medication Sig Start Date End Date Taking? Authorizing Provider  atorvastatin (LIPITOR) 20 MG tablet Take 1 tablet (20 mg total) by mouth daily. 12/08/21  Yes Claiborne Rigg, NP  diclofenac (VOLTAREN) 75 MG EC tablet Take 1 tablet (75 mg total) by mouth 2 (two) times daily as needed. For back pain 09/01/21  Yes Claiborne Rigg, NP  DULoxetine (CYMBALTA) 30 MG capsule Take 1 capsule (30 mg total) by mouth at bedtime. 12/08/21  Yes Claiborne Rigg, NP  moxifloxacin (VIGAMOX) 0.5 % ophthalmic solution Place 1 drop into the right eye 3 (three) times daily. 01/29/22  Yes Allwardt, Alyssa M, PA-C    ROS: Neg HEENT Neg resp Neg cardiac Neg GI Neg GU Neg psych Neg neuro  Objective:   Vitals:   08/25/22 1056  BP: 125/86  Pulse: 90  SpO2: 99%  Weight: 229 lb 9.6 oz (104.1 kg)   Exam General appearance : Awake, alert, not in  any distress. Speech Clear. Not toxic looking HEENT: Atraumatic and Normocephalic Neck: Supple, no JVD. No cervical lymphadenopathy.  Chest: Good air entry bilaterally, CTAB.  No rales/rhonchi/wheezing CVS: S1 S2 regular, no murmurs.  R hip with decreased ROM with internal and external rotation(~50% of normal partly due to body habitus and poor conditioning). No erythema or warmth of joint.  No bursa TTP.   Back with paraspinus spasm in lumbar region and TTP over R S-I joint.  Neg SLR B.  She does have lumbar lordosis.  R ankle with normal ROM and TTP over ATF Extremities: B/L Lower Ext shows no edema, both legs are warm to touch Neurology: Awake alert, and oriented X 3, CN II-XII intact, Non focal Skin: No Rash  Data Review Lab Results  Component Value Date   HGBA1C 6.1 03/16/2022   HGBA1C 6.3 (H) 09/01/2021   HGBA1C 6.1 (H) 02/26/2020    Assessment & Plan   1. Right hip pain Take voltaren as needed - Ambulatory referral to Orthopedic Surgery  2. Right ankle pain, unspecified chronicity Take voltaren as needed - Ambulatory referral to Orthopedic Surgery  3. Sacroiliac pain Take voltaren as needed - Ambulatory referral to Orthopedic Surgery  4. Language barrier [employer] interpre[employer] "Lurena" used and additional time performing  visit was required.     Return for keep upcoming appt with PCP.  The patient was given clear instructions to go to ER or return to medical center if symptoms don't improve, worsen or new problems develop. The patient verbalized understanding. The patient was told to call to get lab results if they haven't heard anything in the next week.      Georgian Co, PA-C [facility] [place], [place] [phone]   08/25/2022, 12:14 PM

## 2022-08-25 NOTE — Telephone Encounter (Signed)
Copied from CRM [id number]. Topic: Referral - Question >> Aug 25, 2022 12:53 PM Tiffany B wrote: Patient was seen today 08/25/2022 by Georgian Co, PA-C , patient states she was referred to ORTHOPEDIC SURGERY. ORTHOPED.does not accept the orange card. Patient seeking another alternative, unsure who she can be referred to that will accept the orange card. Patient would like to know if her Paul financial / orange card is expired or active. Please advise

## 2022-09-14 ENCOUNTER — Ambulatory Visit (INDEPENDENT_AMBULATORY_CARE_PROVIDER_SITE_OTHER): Payer: Self-pay | Admitting: Physician Assistant

## 2022-09-14 ENCOUNTER — Encounter: Payer: Self-pay | Admitting: Physician Assistant

## 2022-09-14 ENCOUNTER — Other Ambulatory Visit (INDEPENDENT_AMBULATORY_CARE_PROVIDER_SITE_OTHER): Payer: Self-pay

## 2022-09-14 DIAGNOSIS — M5440 Lumbago with sciatica, unspecified side: Secondary | ICD-10-CM

## 2022-09-14 NOTE — Progress Notes (Signed)
Office Visit Note   Patient: Kristin [name]           Date of Birth: 1975/12/21           MRN: [id number] Visit Date: 09/14/2022              Requested by: Anders Simmonds, PA-C [address] [place],  [place] [address] PCP: Claiborne Rigg, NP   Assessment & Plan: Visit Diagnoses:  1. Acute right-sided low back pain with sciatica, sciatica laterality unspecified     Plan: Patient is a pleasant 47 year old woman that has had on and off back and thoracic back pain for quite a long time.  About a month and a half ago she reaggravated her lower back when she twisted her ankle.  Her x-rays do show some degenerative changes at L5-S1 and L2-3.  She is neurologically intact.  She gets pain mostly at night.  She has been prescribed medication by her primary care doctor including Voltaren.  I discussed with her for the long-term I think physical therapy to learn proper posture and core strengthening would be of use to her.  Also would recommend she discuss with her primary care physician weight management as I think this is a big contributing factor.  She is willing to do this and has an appointment later this week.  May follow-up with me in about 5 weeks  Follow-Up Instructions: No follow-ups on file.   Orders:  Orders Placed This Encounter  Procedures   XR Lumbar Spine 2-3 Views   Ambulatory referral to Physical Therapy   No orders of the defined types were placed in this encounter.     Procedures: No procedures performed   Clinical Data: No additional findings.   Subjective: No chief complaint on file.   HPI patient is a pleasant 47 year old woman who is here with the help of an interpreter.  She has a history of chronic low back pain.  This has been treated with duloxetine in diclofenac.  She has not had any physical therapy.  She denies any loss of bowel or bladder control.  She states her pain is moderate especially at night.  She has had x-rays of her  low back in the past.  She reaggravated her back about a month and a half ago when she twisted her ankle causing her to have some stress and twisting on her low back.  Denies any paresthesias.  Review of Systems  All other systems reviewed and are negative.    Objective: Vital Signs: There were no vitals taken for this visit.  Physical Exam Constitutional:      Appearance: Normal appearance.  Pulmonary:     Effort: Pulmonary effort is normal.  Neurological:     General: No focal deficit present.     Mental Status: She is alert and oriented to person, place, and time.  Psychiatric:        Mood and Affect: Mood normal.        Behavior: Behavior normal.     Ortho Exam Examination she has some focal pain in her lower back.  No redness no fluctuance.  She has 5 out of 5 strength bilaterally with resisted dorsiflexion plantarflexion of her ankles extension and flexion of her legs.  No pain with manipulation and logrolling of her hips.  Sensation is intact compartments are soft and nontender Specialty Comments:  No specialty comments available.  Imaging: XR Lumbar Spine 2-3 Views  Result Date: 09/14/2022 2 views of her lumbar spine today demonstrate some degenerative changes at L5-S1 with loss of joint space and sclerotic changes some degenerative changes and loss of joint space at L2-3 as well no acute fractures noted    PMFS History: Patient Active Problem List   Diagnosis Date Noted   Major depressive disorder with single episode, in full remission (HCC) 09/01/2021   Nonallopathic lesion of lumbar region 02/18/2020   Myofascial muscle pain 01/02/2019   Chronic left-sided thoracic back pain 01/02/2019   Chronic left-sided low back pain with left-sided sciatica 10/24/2018   Routine adult health maintenance 12/06/2016   Past Medical History:  Diagnosis Date   Ectopic pregnancy    Nephrolithiasis     Family History  Problem Relation Age of Onset   Breast cancer Neg Hx      Past Surgical History:  Procedure Laterality Date   etopic pregnancy     Social History   Occupational History   Not on file  Tobacco Use   Smoking status: Never   Smokeless tobacco: Never  Vaping Use   Vaping status: Never Used  Substance and Sexual Activity   Alcohol use: Not Currently    Comment: occasionally    Drug use: No   Sexual activity: Yes

## 2022-09-16 ENCOUNTER — Ambulatory Visit: Payer: Self-pay | Attending: Nurse Practitioner | Admitting: Nurse Practitioner

## 2022-09-16 ENCOUNTER — Encounter: Payer: Self-pay | Admitting: Nurse Practitioner

## 2022-09-16 VITALS — BP 130/83 | HR 87 | Ht 63.0 in | Wt 229.6 lb

## 2022-09-16 DIAGNOSIS — M546 Pain in thoracic spine: Secondary | ICD-10-CM

## 2022-09-16 DIAGNOSIS — R7303 Prediabetes: Secondary | ICD-10-CM

## 2022-09-16 DIAGNOSIS — G8929 Other chronic pain: Secondary | ICD-10-CM

## 2022-09-16 DIAGNOSIS — E785 Hyperlipidemia, unspecified: Secondary | ICD-10-CM

## 2022-09-16 LAB — POCT GLYCOSYLATED HEMOGLOBIN (HGB A1C): HbA1c, POC (prediabetic range): 6 % (ref 5.7–6.4)

## 2022-09-16 MED ORDER — ATORVASTATIN CALCIUM 20 MG PO TABS: 20.0000 mg | ORAL_TABLET | Freq: Every day | ORAL | 3 refills | Status: DC

## 2022-09-16 MED ORDER — DICLOFENAC SODIUM 75 MG PO TBEC: 75.0000 mg | DELAYED_RELEASE_TABLET | Freq: Two times a day (BID) | ORAL | 1 refills | Status: DC | PRN

## 2022-09-16 MED ORDER — DULOXETINE HCL 30 MG PO CPEP: 30.0000 mg | ORAL_CAPSULE | Freq: Every evening | ORAL | 1 refills | Status: DC

## 2022-09-16 NOTE — Progress Notes (Signed)
Assessment & Plan:  Kristin [name] was seen today for prediabetes.  Diagnoses and all orders for this visit:  Prediabetes -     POCT glycosylated hemoglobin (Hb A1C) -     CMP14+EGFR  Dyslipidemia, goal LDL below 100 -     atorvastatin (LIPITOR) 20 MG tablet; Take 1 tablet (20 mg total) by mouth daily.  Chronic left-sided thoracic back pain -     DULoxetine (CYMBALTA) 30 MG capsule; Take 1 capsule (30 mg total) by mouth at bedtime. -     diclofenac (VOLTAREN) 75 MG EC tablet; Take 1 tablet (75 mg total) by mouth 2 (two) times daily as needed. For back pain  Dyslipidemia, goal LDL below 70 -     Lipid panel    Patient has been counseled on age-appropriate routine health concerns for screening and prevention. These are reviewed and up-to-date. Referrals have been placed accordingly. Immunizations are up-to-date or declined.    Subjective:   Chief Complaint  Patient presents with   Prediabetes   HPI Kristin [name] 47 y.o. female presents to office today for follow up to prediabetes.   VRI was used to communicate directly with patient for the entire encounter including providing detailed patient instructions.    Prediabetes A1c at goal of less than 6.5.  She would like to know what else she can do to help lower her A1c.  She has been walking 30 minutes/day with a maximum of 2500 steps.  She is not currently taking any oral diabetic medications to lower A1c. Lab Results  Component Value Date   HGBA1C 6.0 09/16/2022   HGBA1C 6.0 09/16/2022     Also has questions related to taking Vit B supplements.    Currently being followed by orthopedic for low back pain.  At this time physical therapy has been recommended.       Review of Systems  Constitutional:  Negative for fever, malaise/fatigue and weight loss.  HENT: Negative.  Negative for nosebleeds.   Eyes: Negative.  Negative for blurred vision, double vision and photophobia.  Respiratory: Negative.  Negative for cough  and shortness of breath.   Cardiovascular: Negative.  Negative for chest pain, palpitations and leg swelling.  Gastrointestinal: Negative.  Negative for heartburn, nausea and vomiting.  Musculoskeletal:  Positive for back pain. Negative for myalgias.  Neurological: Negative.  Negative for dizziness, focal weakness, seizures and headaches.  Psychiatric/Behavioral: Negative.  Negative for suicidal ideas.     Past Medical History:  Diagnosis Date   Ectopic pregnancy    Nephrolithiasis     Past Surgical History:  Procedure Laterality Date   etopic pregnancy      Family History  Problem Relation Age of Onset   Breast cancer Neg Hx     Social History Reviewed with no changes to be made today.   Outpatient Medications Prior to Visit  Medication Sig Dispense Refill   atorvastatin (LIPITOR) 20 MG tablet Take 1 tablet (20 mg total) by mouth daily. 90 tablet 3   diclofenac (VOLTAREN) 75 MG EC tablet Take 1 tablet (75 mg total) by mouth 2 (two) times daily as needed. For back pain (Patient taking differently: Take 75 mg by mouth 2 (two) times daily as needed (PRN). For back pain) 180 tablet 1   DULoxetine (CYMBALTA) 30 MG capsule Take 1 capsule (30 mg total) by mouth at bedtime. 90 capsule 1   moxifloxacin (VIGAMOX) 0.5 % ophthalmic solution Place 1 drop into the right eye 3 (three)  times daily. (Patient not taking: Reported on 09/16/2022) 3 mL 0   No facility-administered medications prior to visit.    Allergies  Allergen Reactions   Wellbutrin [Bupropion] Rash       Objective:    BP 130/83 (BP Location: Left Arm, Patient Position: Sitting, Cuff Size: Large)   Pulse 87   Ht 5\' 3"  (1.6 m)   Wt 229 lb 9.6 oz (104.1 kg)   LMP 08/28/2022   SpO2 100%   BMI 40.67 kg/m  Wt Readings from Last 3 Encounters:  09/16/22 229 lb 9.6 oz (104.1 kg)  08/25/22 229 lb 9.6 oz (104.1 kg)  03/16/22 223 lb (101.2 kg)    Physical Exam Vitals and nursing note reviewed.  Constitutional:       Appearance: She is well-developed.  HENT:     Head: Normocephalic and atraumatic.  Cardiovascular:     Rate and Rhythm: Normal rate and regular rhythm.     Heart sounds: Normal heart sounds. No murmur heard.    No friction rub. No gallop.  Pulmonary:     Effort: Pulmonary effort is normal. No tachypnea or respiratory distress.     Breath sounds: Normal breath sounds. No decreased breath sounds, wheezing, rhonchi or rales.  Chest:     Chest wall: No tenderness.  Abdominal:     General: Bowel sounds are normal.     Palpations: Abdomen is soft.  Musculoskeletal:        General: Normal range of motion.     Cervical back: Normal range of motion.  Skin:    General: Skin is warm and dry.  Neurological:     Mental Status: She is alert and oriented to person, place, and time.     Coordination: Coordination normal.  Psychiatric:        Behavior: Behavior normal. Behavior is cooperative.        Thought Content: Thought content normal.        Judgment: Judgment normal.          Patient has been counseled extensively about nutrition and exercise as well as the importance of adherence with medications and regular follow-up. The patient was given clear instructions to go to ER or return to medical center if symptoms don't improve, worsen or new problems develop. The patient verbalized understanding.   Follow-up: Return in about 6 months (around 03/16/2023).   Claiborne Rigg, FNP-BC [facility] [place], [place] [phone]   09/16/2022, 12:06 PM

## 2022-09-17 LAB — CMP14+EGFR
ALT: 13 IU/L (ref 0–32)
AST: 16 IU/L (ref 0–40)
Albumin: 4.3 g/dL (ref 3.9–4.9)
Alkaline Phosphatase: 85 IU/L (ref 44–121)
BUN/Creatinine Ratio: 21 (ref 9–23)
BUN: 14 mg/dL (ref 6–24)
Bilirubin Total: 0.4 mg/dL (ref 0.0–1.2)
CO2: 22 mmol/L (ref 20–29)
Calcium: 9.3 mg/dL (ref 8.7–10.2)
Chloride: 105 mmol/L (ref 96–106)
Creatinine, Ser: 0.66 mg/dL (ref 0.57–1.00)
Globulin, Total: 3.1 g/dL (ref 1.5–4.5)
Glucose: 103 mg/dL — ABNORMAL HIGH (ref 70–99)
Potassium: 4.4 mmol/L (ref 3.5–5.2)
Sodium: 142 mmol/L (ref 134–144)
Total Protein: 7.4 g/dL (ref 6.0–8.5)
eGFR: 108 mL/min/{1.73_m2} (ref >59)

## 2022-09-17 LAB — LIPID PANEL
Chol/HDL Ratio: 3.8 ratio (ref 0.0–4.4)
Cholesterol, Total: 196 mg/dL (ref 100–199)
HDL: 51 mg/dL (ref >39)
LDL Chol Calc (NIH): 129 mg/dL — ABNORMAL HIGH (ref 0–99)
Triglycerides: 88 mg/dL (ref 0–149)
VLDL Cholesterol Cal: 16 mg/dL (ref 5–40)

## 2022-10-04 NOTE — Therapy (Signed)
OUTPATIENT PHYSICAL THERAPY THORACOLUMBAR EVALUATION   Patient Name: Kristin [name] MRN: [id number] DOB:10-25-1975, 47 y.o., female Today's Date: 10/05/2022  END OF SESSION:  PT End of Session - 10/05/22 0808     Visit Number 1    Number of Visits 13    Date for PT Re-Evaluation 11/25/22    Authorization Type CAFA- Self pay    PT Start Time 0800    PT Stop Time 0845    PT Time Calculation (min) 45 min    Activity Tolerance Patient tolerated treatment well    Behavior During Therapy [facility] for tasks assessed/performed             Past Medical History:  Diagnosis Date   Ectopic pregnancy    Nephrolithiasis    Past Surgical History:  Procedure Laterality Date   etopic pregnancy     Patient Active Problem List   Diagnosis Date Noted   Major depressive disorder with single episode, in full remission (HCC) 09/01/2021   Nonallopathic lesion of lumbar region 02/18/2020   Myofascial muscle pain 01/02/2019   Chronic left-sided thoracic back pain 01/02/2019   Chronic left-sided low back pain with left-sided sciatica 10/24/2018   Routine adult health maintenance 12/06/2016    PCP: Claiborne Rigg, NP   REFERRING PROVIDER: Persons, [place], [place]   REFERRING DIAG: M54.40 (ICD-10-CM) - Acute right-sided low back pain with sciatica, sciatica laterality unspecified   Rationale for Evaluation and Treatment: Rehabilitation  THERAPY DIAG:  Chronic bilateral low back pain with right-sided sciatica  Abnormal posture  ONSET DATE: 2 months ago; A few years ago-chronic  SUBJECTIVE:                                                                                                                                                                                           SUBJECTIVE STATEMENT: Pt reports she has had a issue with a disc in her back for several years for which anti-inflammatory medication has helped. 2 months ago she was going down steps, and mis-stepped. She  experienced pain of her R low back, hip, knee and ankle which has gradually improve since this incident. She currently experiences R low back discomfort in the area of the PSIS and will occasionally goes down her R leg. She also notes popping sensation of her R knee and ankle. Weight loss has been recommended by her MD.  PERTINENT HISTORY:  NA  PAIN:  Are you having pain? Yes: NPRS scale: 2-3/10 Pain location: R low back Pain description: discomfort Aggravating factors: walking, home chores-vacuuming, laundry, washing dishes  Relieving factors: Rest-sitting, massage, anti-infammatory medication-takes at  night  PRECAUTIONS: None  RED FLAGS: None   WEIGHT BEARING RESTRICTIONS: No  FALLS:  Has patient fallen in last 6 months? No  LIVING ENVIRONMENT: Lives with: lives with their family Lives in: House/apartment Able to access   OCCUPATION: Home maker  PLOF: Independent  PATIENT GOALS: less pain when active  OBJECTIVE:   DIAGNOSTIC FINDINGS:  09/14/22 Xray Lumbar: 2 views of her lumbar spine today demonstrate some degenerative changes at  L5-S1 with loss of joint space and sclerotic changes some degenerative  changes and loss of joint space at L2-3 as well no acute fractures noted   PATIENT SURVEYS:  FOTO TBA  due to time limitations c lengthy subjective intake  SCREENING FOR RED FLAGS: Bowel or bladder incontinence: No Spinal tumors: No Cauda equina syndrome: No Compression fracture: No  COGNITION: Overall cognitive status: Within functional limits for tasks assessed     SENSATION: WFL  MUSCLE LENGTH: Hamstrings: Right tight deg; Left tight deg Thomas test: Right tight deg; Left tight deg  POSTURE: forward head and increased lumbar lordosis  PALPATION: Not PPT; medial malleoli present as equal  LUMBAR ROM:   AROM eval  Flexion Mod limit; lordosis is not fully reduced  Extension Min limited, discomfort  Right lateral flexion Min limited  Left lateral  flexion Min limited  Right rotation Min limited  Left rotation Min limited   (Blank rows = not tested)  LOWER EXTREMITY ROM:     Grossly WFLs per functional movements Active  Right eval Left eval  Hip flexion    Hip extension    Hip abduction    Hip adduction    Hip internal rotation    Hip external rotation    Knee flexion    Knee extension    Ankle dorsiflexion    Ankle plantarflexion    Ankle inversion    Ankle eversion     (Blank rows = not tested)  LOWER EXTREMITY MMT:   Not assessed- due to time limitations c lengthy subjective intake MMT Right eval Left eval  Hip flexion    Hip extension    Hip abduction    Hip adduction    Hip internal rotation    Hip external rotation    Knee flexion    Knee extension    Ankle dorsiflexion    Ankle plantarflexion    Ankle inversion    Ankle eversion     (Blank rows = not tested)  LUMBAR SPECIAL TESTS:  Straight leg raise test: Negative and Slump test: Negative   FUNCTIONAL TESTS:  5XSTS: NT Weak core- Test bridging and plank form knees tolerance  GAIT: Distance walked: 200' Assistive device utilized: None Level of assistance: Complete Independence Comments: WNLs  TODAY'S TREATMENT:  [facility] Adult PT Treatment:                                                DATE: 10/05/22 Therapeutic Exercise: Developed, instructed in, and pt completed therex as noted in HEP  PATIENT EDUCATION:  Education details: Eval findings, POC, HEP, self care  Person educated: Patient Education method: Explanation, Demonstration, Tactile cues, Verbal cues, and Handouts Education comprehension: verbalized understanding, returned demonstration, verbal cues required, and tactile cues required  HOME EXERCISE PROGRAM: Access Code: [email or web address] URL: [email or web address] Date: 10/05/2022 Prepared by: Joellyn Rued  Exercises - Hooklying Single Knee to Chest  - 2 x daily - 7 x weekly - 1 sets - 3 reps - 20 hold - Supine Hamstring Stretch  - 2 x daily - 7 x weekly - 1 sets - 3 reps - 20 hold  ASSESSMENT:  CLINICAL IMPRESSION: Patient is a 47 y.o. female who was seen today for physical therapy evaluation and treatment for M54.40 (ICD-10-CM) - Acute right-sided low back pain with sciatica, sciatica laterality unspecified. Pt presents with acute on chronic low back pain. Eval revealed moderately decreased trunk forward flexion with low back and hamstring tightness. Pt's lordotic curve did not fully reduce during trunk forward flexion. Pt has a high BMI and weak core. A HEP was initiated to address tight low back and hamstrings. Xray reveals degenerative changes of the low back, see above. Pt will benefit from skilled PT 1-2x per week for 6 weeks to address impairments to optimize function with less pain.   OBJECTIVE IMPAIRMENTS: decreased activity tolerance, decreased ROM, decreased strength, impaired flexibility, postural dysfunction, obesity, and pain.   ACTIVITY LIMITATIONS: carrying, lifting, bending, standing, and locomotion level  PARTICIPATION LIMITATIONS: meal prep, cleaning, and laundry  PERSONAL FACTORS: Fitness, Past/current experiences, Time since onset of injury/illness/exacerbation, and 1 comorbidity: high BMI  are also affecting patient's functional outcome.   REHAB POTENTIAL: Good  CLINICAL DECISION MAKING: Evolving/moderate complexity  EVALUATION COMPLEXITY: Moderate   GOALS:   SHORT TERM GOALS = LTGS  LONG TERM GOALS: Target date: 11/25/22  Pt will be Ind in a final HEP to maintain achieved LOF  Baseline: initiated Goal status: INITIAL  2.  Improved flexibility of the low back and hamstrings to improve lumbar ROM with forward flexion Baseline: mod limitation Goal status: INITIAL  3.  Pt will demonstrate bridging and plank from knees tolerance to 60 and 30"  respectively for improved core strength Baseline: TBA Goal status: INITIAL  4.  Improve 5xSTS by MCID of 5" as indication of improved functional mobility Baseline: TBA Goal status: INITIAL  5.  Pt's FOTO score will improved to the predicted value as indication of improved function  Baseline: TBA Goal status: INITIAL  PLAN:  PT FREQUENCY: 1-2x/week  PT DURATION: 6 weeks  PLANNED INTERVENTIONS: Therapeutic exercises, Therapeutic activity, Gait training, Patient/Family education, Self Care, Joint mobilization, Aquatic Therapy, Dry Needling, Electrical stimulation, Spinal mobilization, Cryotherapy, Moist heat, Taping, Traction, Ultrasound, Manual therapy, and Re-evaluation.  PLAN FOR NEXT SESSION: Assess FOTO, LE strength, 5XSTS, bridging and plank from kness tolerance. Assess response to HEP; progress therex as indicated; use of modalities, manual therapy; and TPDN as indicated.    Chaska Hagger MS, PT 10/05/22 12:40 PM

## 2022-10-05 ENCOUNTER — Ambulatory Visit: Payer: Self-pay | Attending: Physician Assistant

## 2022-10-05 ENCOUNTER — Other Ambulatory Visit: Payer: Self-pay

## 2022-10-05 DIAGNOSIS — G8929 Other chronic pain: Secondary | ICD-10-CM | POA: Insufficient documentation

## 2022-10-05 DIAGNOSIS — M5440 Lumbago with sciatica, unspecified side: Secondary | ICD-10-CM | POA: Insufficient documentation

## 2022-10-05 DIAGNOSIS — R293 Abnormal posture: Secondary | ICD-10-CM | POA: Insufficient documentation

## 2022-10-05 DIAGNOSIS — M5441 Lumbago with sciatica, right side: Secondary | ICD-10-CM | POA: Insufficient documentation

## 2022-10-10 NOTE — Therapy (Signed)
OUTPATIENT PHYSICAL THERAPY THORACOLUMBAR TREATMENT NOTE   Patient Name: Kristin [name] MRN: [id number] DOB:1975-10-15, 47 y.o., female Today's Date: 10/10/2022  END OF SESSION:    Past Medical History:  Diagnosis Date   Ectopic pregnancy    Nephrolithiasis    Past Surgical History:  Procedure Laterality Date   etopic pregnancy     Patient Active Problem List   Diagnosis Date Noted   Major depressive disorder with single episode, in full remission (HCC) 09/01/2021   Nonallopathic lesion of lumbar region 02/18/2020   Myofascial muscle pain 01/02/2019   Chronic left-sided thoracic back pain 01/02/2019   Chronic left-sided low back pain with left-sided sciatica 10/24/2018   Routine adult health maintenance 12/06/2016    PCP: Claiborne Rigg, NP   REFERRING PROVIDER: Persons, [place], [place]   REFERRING DIAG: M54.40 (ICD-10-CM) - Acute right-sided low back pain with sciatica, sciatica laterality unspecified   Rationale for Evaluation and Treatment: Rehabilitation  THERAPY DIAG:  No diagnosis found.  ONSET DATE: 2 months ago; A few years ago-chronic  SUBJECTIVE:                                                                                                                                                                                           SUBJECTIVE STATEMENT:   EVAL: Pt reports she has had a issue with a disc in her back for several years for which anti-inflammatory medication has helped. 2 months ago she was going down steps, and mis-stepped. She experienced pain of her R low back, hip, knee and ankle which has gradually improve since this incident. She currently experiences R low back discomfort in the area of the PSIS and will occasionally goes down her R leg. She also notes popping sensation of her R knee and ankle. Weight loss has been recommended by her MD.  PERTINENT HISTORY:  NA  PAIN:  Are you having pain? Yes: NPRS scale: 2-3/10 Pain  location: R low back Pain description: discomfort Aggravating factors: walking, home chores-vacuuming, laundry, washing dishes  Relieving factors: Rest-sitting, massage, anti-infammatory medication-takes at night  PRECAUTIONS: None  RED FLAGS: None   WEIGHT BEARING RESTRICTIONS: No  FALLS:  Has patient fallen in last 6 months? No  LIVING ENVIRONMENT: Lives with: lives with their family Lives in: House/apartment Able to access   OCCUPATION: Home maker  PLOF: Independent  PATIENT GOALS: less pain when active  OBJECTIVE:   DIAGNOSTIC FINDINGS:  09/14/22 Xray Lumbar: 2 views of her lumbar spine today demonstrate some degenerative changes at  L5-S1 with loss of joint space and sclerotic changes some degenerative  changes and loss  of joint space at L2-3 as well no acute fractures noted   PATIENT SURVEYS:  FOTO TBA  due to time limitations c lengthy subjective intake  SCREENING FOR RED FLAGS: Bowel or bladder incontinence: No Spinal tumors: No Cauda equina syndrome: No Compression fracture: No  COGNITION: Overall cognitive status: Within functional limits for tasks assessed     SENSATION: WFL  MUSCLE LENGTH: Hamstrings: Right tight deg; Left tight deg Thomas test: Right tight deg; Left tight deg  POSTURE: forward head and increased lumbar lordosis  PALPATION: Not PPT; medial malleoli present as equal  LUMBAR ROM:   AROM eval  Flexion Mod limit; lordosis is not fully reduced  Extension Min limited, discomfort  Right lateral flexion Min limited  Left lateral flexion Min limited  Right rotation Min limited  Left rotation Min limited   (Blank rows = not tested)  LOWER EXTREMITY ROM:     Grossly WFLs per functional movements Active  Right eval Left eval  Hip flexion    Hip extension    Hip abduction    Hip adduction    Hip internal rotation    Hip external rotation    Knee flexion    Knee extension    Ankle dorsiflexion    Ankle plantarflexion     Ankle inversion    Ankle eversion     (Blank rows = not tested)  LOWER EXTREMITY MMT:   Not assessed- due to time limitations c lengthy subjective intake MMT Right eval Left eval  Hip flexion    Hip extension    Hip abduction    Hip adduction    Hip internal rotation    Hip external rotation    Knee flexion    Knee extension    Ankle dorsiflexion    Ankle plantarflexion    Ankle inversion    Ankle eversion     (Blank rows = not tested)  LUMBAR SPECIAL TESTS:  Straight leg raise test: Negative and Slump test: Negative   FUNCTIONAL TESTS:  5XSTS: NT Weak core- Test bridging and plank form knees tolerance  GAIT: Distance walked: 200' Assistive device utilized: None Level of assistance: Complete Independence Comments: WNLs  TODAY'S TREATMENT:  [facility] Adult PT Treatment:                                                DATE: 10/12/22 Therapeutic Exercise: *** Manual Therapy: *** Neuromuscular re-ed: *** Therapeutic Activity: *** Modalities: *** Self Care: [facility] Adult PT Treatment:                                                DATE: 10/05/22 Therapeutic Exercise: Developed, instructed in, and pt completed therex as noted in HEP  PATIENT EDUCATION:  Education details: Eval findings, POC, HEP, self care  Person educated: Patient Education method: Explanation, Demonstration, Tactile cues, Verbal cues, and Handouts Education comprehension: verbalized understanding, returned demonstration, verbal cues required, and tactile cues required  HOME EXERCISE PROGRAM: Access Code: [email or web address] URL: [email or web address] Date: 10/05/2022 Prepared by: Joellyn Rued  Exercises - Hooklying Single Knee to Chest  - 2 x daily - 7 x weekly - 1 sets - 3 reps - 20 hold - Supine Hamstring Stretch  - 2 x daily - 7 x weekly - 1 sets - 3 reps - 20  hold  ASSESSMENT:  CLINICAL IMPRESSION:   EVAL: Patient is a 47 y.o. female who was seen today for physical therapy evaluation and treatment for M54.40 (ICD-10-CM) - Acute right-sided low back pain with sciatica, sciatica laterality unspecified. Pt presents with acute on chronic low back pain. Eval revealed moderately decreased trunk forward flexion with low back and hamstring tightness. Pt's lordotic curve did not fully reduce during trunk forward flexion. Pt has a high BMI and weak core. A HEP was initiated to address tight low back and hamstrings. Xray reveals degenerative changes of the low back, see above. Pt will benefit from skilled PT 1-2x per week for 6 weeks to address impairments to optimize function with less pain.   OBJECTIVE IMPAIRMENTS: decreased activity tolerance, decreased ROM, decreased strength, impaired flexibility, postural dysfunction, obesity, and pain.   ACTIVITY LIMITATIONS: carrying, lifting, bending, standing, and locomotion level  PARTICIPATION LIMITATIONS: meal prep, cleaning, and laundry  PERSONAL FACTORS: Fitness, Past/current experiences, Time since onset of injury/illness/exacerbation, and 1 comorbidity: high BMI  are also affecting patient's functional outcome.   REHAB POTENTIAL: Good  CLINICAL DECISION MAKING: Evolving/moderate complexity  EVALUATION COMPLEXITY: Moderate   GOALS:   SHORT TERM GOALS = LTGS  LONG TERM GOALS: Target date: 11/25/22  Pt will be Ind in a final HEP to maintain achieved LOF  Baseline: initiated Goal status: INITIAL  2.  Improved flexibility of the low back and hamstrings to improve lumbar ROM with forward flexion Baseline: mod limitation Goal status: INITIAL  3.  Pt will demonstrate bridging and plank from knees tolerance to 60 and 30" respectively for improved core strength Baseline: TBA Goal status: INITIAL  4.  Improve 5xSTS by MCID of 5" as indication of improved functional mobility Baseline: TBA Goal  status: INITIAL  5.  Pt's FOTO score will improved to the predicted value as indication of improved function  Baseline: TBA Goal status: INITIAL  PLAN:  PT FREQUENCY: 1-2x/week  PT DURATION: 6 weeks  PLANNED INTERVENTIONS: Therapeutic exercises, Therapeutic activity, Gait training, Patient/Family education, Self Care, Joint mobilization, Aquatic Therapy, Dry Needling, Electrical stimulation, Spinal mobilization, Cryotherapy, Moist heat, Taping, Traction, Ultrasound, Manual therapy, and Re-evaluation.  PLAN FOR NEXT SESSION: Assess FOTO, LE strength, 5XSTS, bridging and plank from kness tolerance. Assess response to HEP; progress therex as indicated; use of modalities, manual therapy; and TPDN as indicated.    Khaniya Tenaglia MS, PT 10/10/22 9:48 PM

## 2022-10-12 ENCOUNTER — Ambulatory Visit: Payer: Self-pay | Attending: Physician Assistant

## 2022-10-12 DIAGNOSIS — M5441 Lumbago with sciatica, right side: Secondary | ICD-10-CM | POA: Insufficient documentation

## 2022-10-12 DIAGNOSIS — G8929 Other chronic pain: Secondary | ICD-10-CM | POA: Insufficient documentation

## 2022-10-12 DIAGNOSIS — R293 Abnormal posture: Secondary | ICD-10-CM | POA: Insufficient documentation

## 2022-10-18 NOTE — Therapy (Unsigned)
OUTPATIENT PHYSICAL THERAPY THORACOLUMBAR TREATMENT NOTE   Patient Name: Kristin [name] MRN: [id number] DOB:January 05, 1976, 47 y.o., female Today's Date: 10/18/2022  END OF SESSION:     Past Medical History:  Diagnosis Date   Ectopic pregnancy    Nephrolithiasis    Past Surgical History:  Procedure Laterality Date   etopic pregnancy     Patient Active Problem List   Diagnosis Date Noted   Major depressive disorder with single episode, in full remission (HCC) 09/01/2021   Nonallopathic lesion of lumbar region 02/18/2020   Myofascial muscle pain 01/02/2019   Chronic left-sided thoracic back pain 01/02/2019   Chronic left-sided low back pain with left-sided sciatica 10/24/2018   Routine adult health maintenance 12/06/2016    PCP: Claiborne Rigg, NP   REFERRING PROVIDER: Persons, [place], [place]   REFERRING DIAG: M54.40 (ICD-10-CM) - Acute right-sided low back pain with sciatica, sciatica laterality unspecified   Rationale for Evaluation and Treatment: Rehabilitation  THERAPY DIAG:  No diagnosis found.  ONSET DATE: 2 months ago; A few years ago-chronic  SUBJECTIVE:                                                                                                                                                                                           SUBJECTIVE STATEMENT: After sleeping for 3 hours last night she woke up with increased R low back of 3/10. This AM the pain is low. She reports completing her initial HEP daily.Marland Kitchen  EVAL: Pt reports she has had a issue with a disc in her back for several years for which anti-inflammatory medication has helped. 2 months ago she was going down steps, and mis-stepped. She experienced pain of her R low back, hip, knee and ankle which has gradually improve since this incident. She currently experiences R low back discomfort in the area of the PSIS and will occasionally goes down her R leg. She also notes popping sensation of  her R knee and ankle. Weight loss has been recommended by her MD.  PERTINENT HISTORY:  NA  PAIN:  Are you having pain? Yes: NPRS scale: 1/10 Pain location: R low back Pain description: discomfort Aggravating factors: walking, home chores-vacuuming, laundry, washing dishes  Relieving factors: Rest-sitting, massage, anti-infammatory medication-takes at night  PRECAUTIONS: None  RED FLAGS: None   WEIGHT BEARING RESTRICTIONS: No  FALLS:  Has patient fallen in last 6 months? No  LIVING ENVIRONMENT: Lives with: lives with their family Lives in: House/apartment Able to access   OCCUPATION: Home maker  PLOF: Independent  PATIENT GOALS: less pain when active  OBJECTIVE:   DIAGNOSTIC FINDINGS:  09/14/22  Xray Lumbar: 2 views of her lumbar spine today demonstrate some degenerative changes at  L5-S1 with loss of joint space and sclerotic changes some degenerative  changes and loss of joint space at L2-3 as well no acute fractures noted   PATIENT SURVEYS:  FOTO TBA  due to time limitations c lengthy subjective intake  SCREENING FOR RED FLAGS: Bowel or bladder incontinence: No Spinal tumors: No Cauda equina syndrome: No Compression fracture: No  COGNITION: Overall cognitive status: Within functional limits for tasks assessed     SENSATION: WFL  MUSCLE LENGTH: Hamstrings: Right tight deg; Left tight deg Thomas test: Right tight deg; Left tight deg  POSTURE: forward head and increased lumbar lordosis  PALPATION: Not PPT; medial malleoli present as equal  LUMBAR ROM:   AROM eval  Flexion Mod limit; lordosis is not fully reduced  Extension Min limited, discomfort  Right lateral flexion Min limited  Left lateral flexion Min limited  Right rotation Min limited  Left rotation Min limited   (Blank rows = not tested)  LOWER EXTREMITY ROM:     Grossly WFLs per functional movements Active  Right eval Left eval  Hip flexion    Hip extension    Hip abduction     Hip adduction    Hip internal rotation    Hip external rotation    Knee flexion    Knee extension    Ankle dorsiflexion    Ankle plantarflexion    Ankle inversion    Ankle eversion     (Blank rows = not tested)  LOWER EXTREMITY MMT:   Not assessed- due to time limitations c lengthy subjective intake MMT Right eval Left eval  Hip flexion 5 5  Hip extension 4 4  Hip abduction    Hip adduction 5 5  Hip internal rotation    Hip external rotation 5 5  Knee flexion    Knee extension    Ankle dorsiflexion    Ankle plantarflexion    Ankle inversion    Ankle eversion     (Blank rows = not tested)  LUMBAR SPECIAL TESTS:  Straight leg raise test: Negative and Slump test: Negative   FUNCTIONAL TESTS:  5XSTS: NT  9.2 sec Weak core- Test bridging and plank form knees tolerance  GAIT: Distance walked: 200' Assistive device utilized: None Level of assistance: Complete Independence Comments: WNLs  TODAY'S TREATMENT:   [facility] Adult PT Treatment:                                                DATE: 10/18/22 Therapeutic Exercise: *** Manual Therapy: *** Neuromuscular re-ed: *** Therapeutic Activity: *** Modalities: *** Self Care: ***  [facility] Adult PT Treatment:                                                DATE: 10/12/22 Therapeutic Exercise: SKTC x2 20" Supine hamstring c strap 2x 20" PPT x10 3", verbal cueing H/L Hip add ball sqeeze x10 3" H/L Hip clam x10 3" 5xsts Strength testing of hips Self care: Pt ed for sleeping positions in SL with    [facility] Adult PT Treatment:  DATE: 10/05/22 Therapeutic Exercise: Developed, instructed in, and pt completed therex as noted in HEP                                                                                                                              PATIENT EDUCATION:  Education details: Eval findings, POC, HEP, self care  Person educated: Patient Education method:  Explanation, Demonstration, Tactile cues, Verbal cues, and Handouts Education comprehension: verbalized understanding, returned demonstration, verbal cues required, and tactile cues required  HOME EXERCISE PROGRAM: Access Code: [email or web address] URL: [email or web address] Date: 10/12/2022 Prepared by: Joellyn Rued  Exercises - Hooklying Single Knee to Chest  - 2 x daily - 7 x weekly - 1 sets - 3 reps - 20 hold - Supine Hamstring Stretch with Strap  - 2 x daily - 7 x weekly - 1 sets - 3 reps - 20 hold - Supine Posterior Pelvic Tilt  - 2 x daily - 7 x weekly - 1 sets - 10 reps - 3 hold - Supine Hip Adduction Isometric with Ball  - 2 x daily - 7 x weekly - 1 sets - 10 reps - 3 hold - Hooklying Clamshell with Resistance  - 2 x daily - 7 x weekly - 1 sets - 10 reps - 3 hold  ASSESSMENT:  CLINICAL IMPRESSION: Completed 5xSTS and hip strength testing. 5xSTS was WNLs and hip strength was good, except hip ext was somewhat weak. PT was completed for lumbopelvic flexibility c a flexion bias and for core/hip strengthening.  Pt tolerated prescribed therex today without adverse effects. Pt reported no R low back pain at the end of the session. At end of session, assessed supine to long sit with the R malleolus positioning changing from level c L in supine to longer than the L in long sit, indicating possible R innominate positioning issue. Will continue to assess innominate and assess FOTO the next PT session when a onsite interpreter is present.  EVAL: Patient is a 47 y.o. female who was seen today for physical therapy evaluation and treatment for M54.40 (ICD-10-CM) - Acute right-sided low back pain with sciatica, sciatica laterality unspecified. Pt presents with acute on chronic low back pain. Eval revealed moderately decreased trunk forward flexion with low back and hamstring tightness. Pt's lordotic curve did not fully reduce during trunk forward flexion. Pt has a high BMI and weak core. A HEP was  initiated to address tight low back and hamstrings. Xray reveals degenerative changes of the low back, see above. Pt will benefit from skilled PT 1-2x per week for 6 weeks to address impairments to optimize function with less pain.   OBJECTIVE IMPAIRMENTS: decreased activity tolerance, decreased ROM, decreased strength, impaired flexibility, postural dysfunction, obesity, and pain.   ACTIVITY LIMITATIONS: carrying, lifting, bending, standing, and locomotion level  PARTICIPATION LIMITATIONS: meal prep, cleaning, and laundry  PERSONAL FACTORS: Fitness, Past/current experiences, Time since onset of injury/illness/exacerbation, and 1 comorbidity: high BMI  are  also affecting patient's functional outcome.   REHAB POTENTIAL: Good  CLINICAL DECISION MAKING: Evolving/moderate complexity  EVALUATION COMPLEXITY: Moderate   GOALS:   SHORT TERM GOALS = LTGS  LONG TERM GOALS: Target date: 11/25/22  Pt will be Ind in a final HEP to maintain achieved LOF  Baseline: initiated Goal status: INITIAL  2.  Improved flexibility of the low back and hamstrings to improve lumbar ROM with forward flexion Baseline: mod limitation Goal status: INITIAL  3.  Pt will demonstrate bridging and plank from knees tolerance to 60 and 30" respectively for improved core strength Baseline: TBA Goal status: INITIAL  4.  Improve 5xSTS by MCID of 5" as indication of improved functional mobility Baseline: TBA Goal status: Tested and found WNLS  5.  Pt's FOTO score will improved to the predicted value as indication of improved function  Baseline: TBA Goal status: INITIAL  PLAN:  PT FREQUENCY: 1-2x/week  PT DURATION: 6 weeks  PLANNED INTERVENTIONS: Therapeutic exercises, Therapeutic activity, Gait training, Patient/Family education, Self Care, Joint mobilization, Aquatic Therapy, Dry Needling, Electrical stimulation, Spinal mobilization, Cryotherapy, Moist heat, Taping, Traction, Ultrasound, Manual therapy, and  Re-evaluation.  PLAN FOR NEXT SESSION: Assess FOTO, bridging and plank from kness tolerance. Assess response to HEP; progress therex as indicated; use of modalities, manual therapy; and TPDN as indicated.    Allen Ralls MS, PT 10/18/22 9:49 AM

## 2022-10-19 ENCOUNTER — Ambulatory Visit: Payer: Self-pay | Admitting: Physical Therapy

## 2022-10-19 ENCOUNTER — Ambulatory Visit: Payer: Self-pay | Admitting: Physician Assistant

## 2022-10-19 ENCOUNTER — Encounter: Payer: Self-pay | Admitting: Physical Therapy

## 2022-10-19 DIAGNOSIS — R293 Abnormal posture: Secondary | ICD-10-CM

## 2022-10-19 DIAGNOSIS — G8929 Other chronic pain: Secondary | ICD-10-CM

## 2022-10-26 ENCOUNTER — Encounter: Payer: Self-pay | Admitting: Physical Therapy

## 2022-10-26 ENCOUNTER — Ambulatory Visit: Payer: Self-pay | Admitting: Physical Therapy

## 2022-10-26 DIAGNOSIS — G8929 Other chronic pain: Secondary | ICD-10-CM

## 2022-10-26 DIAGNOSIS — R293 Abnormal posture: Secondary | ICD-10-CM

## 2022-10-26 NOTE — Therapy (Addendum)
 OUTPATIENT PHYSICAL THERAPY THORACOLUMBAR TREATMENT /DC   Patient Name: Kristin [name] MRN: [id number] DOB:September 24, 1975, 47 y.o., female Today's Date: 10/26/2022  END OF SESSION:  PT End of Session - 10/26/22 0804     Visit Number 4    Number of Visits 13    Date for PT Re-Evaluation 11/25/22    Authorization Type CAFA- Self pay    PT Start Time 0803    PT Stop Time 0842    PT Time Calculation (min) 39 min               Past Medical History:  Diagnosis Date   Ectopic pregnancy    Nephrolithiasis    Past Surgical History:  Procedure Laterality Date   etopic pregnancy     Patient Active Problem List   Diagnosis Date Noted   Major depressive disorder with single episode, in full remission (HCC) 09/01/2021   Nonallopathic lesion of lumbar region 02/18/2020   Myofascial muscle pain 01/02/2019   Chronic left-sided thoracic back pain 01/02/2019   Chronic left-sided low back pain with left-sided sciatica 10/24/2018   Routine adult health maintenance 12/06/2016    PCP: Theotis Haze ORN, NP   REFERRING PROVIDER: Persons, Ronal Dragon, PA   REFERRING DIAG: M54.40 (ICD-10-CM) - Acute right-sided low back pain with sciatica, sciatica laterality unspecified   Rationale for Evaluation and Treatment: Rehabilitation  THERAPY DIAG:  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  Just uncomfortable when sitting or standing. I feel tired in my hips. I am walking on the treadmill 20 min , twice a day.   PERTINENT HISTORY:  NA  PAIN:  Are you having  pain? Yes: NPRS scale: 0/10 Pain location: R low back Pain description: discomfort Aggravating factors: walking, home chores-vacuuming, laundry, washing dishes  Relieving factors: Rest-sitting, massage, anti-infammatory medication-takes at night  PRECAUTIONS: None  RED FLAGS: None   WEIGHT BEARING RESTRICTIONS: No  FALLS:  Has patient fallen in last 6 months? No  LIVING ENVIRONMENT: Lives with: lives with their family Lives in: House/apartment Able to access   OCCUPATION: Home maker  PLOF: Independent  PATIENT GOALS: less pain when active  OBJECTIVE:   DIAGNOSTIC  FINDINGS:  09/14/22 Xray Lumbar: 2 views of her lumbar spine today demonstrate some degenerative changes at  L5-S1 with loss of joint space and sclerotic changes some degenerative  changes and loss of joint space at L2-3 as well no acute fractures noted   PATIENT SURVEYS:  FOTO TBA due to time limitations c lengthy subjective intake DEFERRED-   SCREENING FOR RED FLAGS: Bowel or bladder incontinence: No Spinal tumors: No Cauda equina syndrome: No Compression fracture: No  COGNITION: Overall cognitive status: Within functional limits for tasks assessed     SENSATION: WFL  MUSCLE LENGTH: Hamstrings: Right tight deg; Left tight deg Thomas test: Right tight deg; Left tight deg  POSTURE: forward head and increased lumbar lordosis  PALPATION: Not PPT; medial malleoli present as equal  LUMBAR ROM:   AROM eval  Flexion Mod limit; lordosis is not fully reduced  Extension Min limited, discomfort  Right lateral flexion Min limited  Left lateral flexion Min limited  Right rotation Min limited  Left rotation Min limited   (Blank rows = not tested)  LOWER EXTREMITY ROM:     Grossly WFLs per functional movements Active  Right eval Left eval  Hip flexion    Hip extension    Hip abduction    Hip adduction    Hip internal rotation    Hip external rotation    Knee flexion    Knee extension     Ankle dorsiflexion    Ankle plantarflexion    Ankle inversion    Ankle eversion     (Blank rows = not tested)  LOWER EXTREMITY MMT:   Not assessed- due to time limitations c lengthy subjective intake MMT Right eval Left eval  Hip flexion 5 5  Hip extension 4 4  Hip abduction    Hip adduction 5 5  Hip internal rotation    Hip external rotation 5 5  Knee flexion    Knee extension    Ankle dorsiflexion    Ankle plantarflexion    Ankle inversion    Ankle eversion     (Blank rows = not tested)  LUMBAR SPECIAL TESTS:  Straight leg raise test: Negative and Slump test: Negative   FUNCTIONAL TESTS:  5XSTS: NT 9.2 sec Weak core- Test bridging and plank form knees tolerance  GAIT: Distance walked: 200' Assistive device utilized: None Level of assistance: Complete Independence Comments: WNLs  TODAY'S TREATMENT:   [facility] Adult PT Treatment:                                                DATE: 10/26/22 Therapeutic Exercise: Seated lumbar flexion with ball , added laterals  Seated hamstring stretch with strap  LTR SKTC 10 sec x 2 each  Figure 4 push and pull  Supine h/s stretch with strap x 2 each Seated PPT     [facility] Adult PT Treatment:                                                DATE: 10/18/22 Therapeutic Exercise: Knee to chest , knee to opp shoulder, used towel for A to relax  Lower trunk rotation Sidelying Rt trunk stretch L stretch knees bent (cues needed)  Manual Therapy: PROM  Rt hip LAD Rt LE x 5 Overpressure for stretching into rotation Sidelying pressure to Rt iilac crest, anterior rotation and soft tissue massage to Rt trunk (mid thoracic to lumbosacral)  Self Care: Husband present for a portion of the manual therapy for instruction/demo for home    Trunk ROM WNL, knee hyperext, chest pain knee pain when reaching to toes     [facility] Adult PT Treatment:                                                DATE: 10/12/22 Therapeutic Exercise: SKTC x2  20 Supine hamstring c strap 2x 20 PPT x10 3, verbal cueing H/L Hip add ball sqeeze x10 3 H/L Hip clam x10 3 5xsts Strength testing of hips Self care: Pt ed for sleeping positions in SL with    [facility] Adult PT Treatment:                                                DATE: 10/05/22 Therapeutic Exercise: Developed, instructed in, and pt completed therex as noted in HEP                                                                                                                              PATIENT EDUCATION:  Education details: Eval findings, POC, HEP, self care  Person educated: Patient Education method: Explanation, Demonstration, Tactile cues, Verbal cues, and Handouts Education comprehension: verbalized understanding, returned demonstration, verbal cues required, and tactile cues required  HOME EXERCISE PROGRAM: Access Code: [email or web address] URL: [email or web address] Date: 10/12/2022 Prepared by: Dasie Daft  Exercises - Hooklying Single Knee to Chest  - 2 x daily - 7 x weekly - 1 sets - 3 reps - 20 hold - Supine Hamstring Stretch with Strap  - 2 x daily - 7 x weekly - 1 sets - 3 reps - 20 hold - Supine Posterior Pelvic Tilt  - 2 x daily - 7 x weekly - 1 sets - 10 reps - 3 hold - Supine Hip Adduction Isometric with Ball  - 2 x daily - 7 x weekly - 1 sets - 10 reps - 3 hold - Hooklying Clamshell with Resistance  - 2 x daily - 7 x weekly - 1 sets - 10 reps - 3 hold -L stretch and sidelying QL   ASSESSMENT:  CLINICAL IMPRESSION: Pt with questions again today about her condition. Recommended she contact MD if she feels like she needs more assessment of condition and explained the role of PT in her care.  She reports no pain in sitting, increased pain with lying on back or side. Continued with flexibility of trunk and hips as well as gentle  core. She reported discomfort with most exercises and was encouraged to move slowly and in a comfortable ROM.   EVAL: Patient is a  47 y.o. female who was seen today for physical therapy evaluation and treatment for M54.40 (ICD-10-CM) - Acute right-sided low back pain with sciatica, sciatica laterality unspecified. Pt presents with acute on chronic low back pain. Eval revealed moderately decreased trunk forward flexion with low back and hamstring tightness. Pt's lordotic curve did not fully reduce during trunk forward flexion. Pt has a high BMI and weak core. A HEP was initiated to address tight low back and hamstrings. Xray reveals degenerative changes of the low back, see above. Pt will benefit from skilled PT 1-2x per week for 6 weeks to address impairments to optimize function with less pain.   OBJECTIVE IMPAIRMENTS: decreased activity tolerance, decreased ROM, decreased strength, impaired flexibility, postural dysfunction, obesity, and pain.   ACTIVITY LIMITATIONS: carrying, lifting, bending, standing, and locomotion level  PARTICIPATION LIMITATIONS: meal prep, cleaning, and laundry  PERSONAL FACTORS: Fitness, Past/current experiences, Time since onset of injury/illness/exacerbation, and 1 comorbidity: high BMI are also affecting patient's functional outcome.   REHAB POTENTIAL: Good  CLINICAL DECISION MAKING: Evolving/moderate complexity  EVALUATION COMPLEXITY: Moderate   GOALS:   SHORT TERM GOALS = LTGS  LONG TERM GOALS: Target date: 11/25/22  Pt will be Ind in a final HEP to maintain achieved LOF  Baseline: initiated Goal status: INITIAL  2.  Improved flexibility of the low back and hamstrings to improve lumbar ROM with forward flexion Baseline: mod limitation Goal status: INITIAL  3.  Pt will demonstrate bridging and plank from knees tolerance to 60 and 30 respectively for improved core strength Baseline: TBA Goal status: INITIAL  4.  Improve 5xSTS by MCID of 5 as indication of improved functional mobility Baseline: TBA Goal status: Tested and found WNLS  5.  Pt's FOTO score will improved to the  predicted value as indication of improved function  Baseline: TBA Goal status: DEFERRED, not captured in first 3 visits  PLAN:  PT FREQUENCY: 1-2x/week  PT DURATION: 6 weeks  PLANNED INTERVENTIONS: Therapeutic exercises, Therapeutic activity, Gait training, Patient/Family education, Self Care, Joint mobilization, Aquatic Therapy, Dry Needling, Electrical stimulation, Spinal mobilization, Cryotherapy, Moist heat, Taping, Traction, Ultrasound, Manual therapy, and Re-evaluation.  PLAN FOR NEXT SESSION: Assess FOTO, bridging and plank from kness tolerance. Assess response to HEP; progress therex as indicated; use of modalities, manual therapy; and TPDN as indicated.   Harlene Persons, PTA 10/26/22 8:45 AM Phone: [phone] Fax: [phone]   PHYSICAL THERAPY DISCHARGE SUMMARY  Visits from Start of Care: 4  Current functional level related to goals / functional outcomes: See clinical impression and PT goals    Remaining deficits: See clinical impression and PT goals    Education / Equipment: HEP/Pt Ed   Patient agrees to discharge. Patient goals were not met. Patient is being discharged due to not returning since the last visit.   Allen Ralls MS, PT 08/15/23 1:21 PM

## 2022-11-02 ENCOUNTER — Ambulatory Visit: Payer: Self-pay | Admitting: Physical Therapy

## 2022-11-09 ENCOUNTER — Ambulatory Visit: Payer: Self-pay | Admitting: Physical Therapy

## 2022-11-16 ENCOUNTER — Ambulatory Visit: Payer: Self-pay | Admitting: Physical Therapy

## 2023-03-20 ENCOUNTER — Other Ambulatory Visit: Payer: Self-pay | Admitting: Nurse Practitioner

## 2023-03-20 DIAGNOSIS — G8929 Other chronic pain: Secondary | ICD-10-CM

## 2023-03-20 MED ORDER — DICLOFENAC SODIUM 75 MG PO TBEC: 75.0000 mg | DELAYED_RELEASE_TABLET | Freq: Two times a day (BID) | ORAL | 0 refills | Status: DC | PRN

## 2023-03-20 MED ORDER — DULOXETINE HCL 30 MG PO CPEP: 30.0000 mg | ORAL_CAPSULE | Freq: Every evening | ORAL | 0 refills | Status: DC

## 2023-03-20 NOTE — Telephone Encounter (Signed)
 Last Fill: Duloxetine: 09/16/22     Voltaren: 09/16/22  Last OV: 09/16/22 Next OV: 04/12/23  Routing to provider for review/authorization.

## 2023-03-20 NOTE — Telephone Encounter (Signed)
 Copied from CRM [id number]. Topic: Clinical - Medication Refill >> Mar 20, 2023  1:28 PM Emylou G wrote: Most Recent Primary Care Visit:  Provider: Bertram Denver W  Department: [facility]  Visit Type: OFFICE VISIT  Date: 09/16/2022  Medication: DULoxetine (CYMBALTA) 30 MG capsule diclofenac (VOLTAREN) 75 MG EC tablet   Has the patient contacted their pharmacy? No (Agent: If no, request that the patient contact the pharmacy for the refill. If patient does not wish to contact the pharmacy document the reason why and proceed with request.) (Agent: If yes, when and what did the pharmacy advise?)  Is this the correct pharmacy for this prescription? Yes If no, delete pharmacy and type the correct one.  This is the patient's preferred pharmacy:  Would like the medication be picked up at the clinic: [facility] not [facility] this time   Has the prescription been filled recently? No  Is the patient out of the medication? Yes  Has the patient been seen for an appointment in the last year OR does the patient have an upcoming appointment? Yes  Can we respond through MyChart? Yes  Agent: Please be advised that Rx refills may take up to 3 business days. We ask that you follow-up with your pharmacy.

## 2023-03-22 ENCOUNTER — Ambulatory Visit: Payer: Self-pay | Admitting: Nurse Practitioner

## 2023-04-12 ENCOUNTER — Ambulatory Visit: Payer: Self-pay | Attending: Nurse Practitioner | Admitting: Nurse Practitioner

## 2023-04-12 ENCOUNTER — Other Ambulatory Visit: Payer: Self-pay

## 2023-04-12 ENCOUNTER — Encounter: Payer: Self-pay | Admitting: Nurse Practitioner

## 2023-04-12 VITALS — BP 122/82 | HR 103 | Resp 20 | Ht 63.0 in | Wt 240.0 lb

## 2023-04-12 DIAGNOSIS — E785 Hyperlipidemia, unspecified: Secondary | ICD-10-CM

## 2023-04-12 DIAGNOSIS — M546 Pain in thoracic spine: Secondary | ICD-10-CM

## 2023-04-12 DIAGNOSIS — R7303 Prediabetes: Secondary | ICD-10-CM

## 2023-04-12 DIAGNOSIS — G8929 Other chronic pain: Secondary | ICD-10-CM

## 2023-04-12 MED ORDER — DICLOFENAC SODIUM 75 MG PO TBEC
75.0000 mg | DELAYED_RELEASE_TABLET | Freq: Two times a day (BID) | ORAL | 1 refills | Status: AC | PRN
  Filled 2023-04-12 – 2023-08-02 (×2): qty 60, 30d supply, fill #0
  Filled 2023-11-01: qty 60, 30d supply, fill #1

## 2023-04-12 MED ORDER — ATORVASTATIN CALCIUM 20 MG PO TABS
20.0000 mg | ORAL_TABLET | Freq: Every day | ORAL | 3 refills | Status: DC
  Filled 2023-04-12 – 2023-08-02 (×2): qty 90, 90d supply, fill #0

## 2023-04-12 MED ORDER — DULOXETINE HCL 30 MG PO CPEP
30.0000 mg | ORAL_CAPSULE | Freq: Every evening | ORAL | 1 refills | Status: AC
Start: 2023-04-12 — End: ?
  Filled 2023-04-12 – 2023-08-02 (×2): qty 90, 90d supply, fill #0
  Filled 2023-11-01: qty 90, 90d supply, fill #1

## 2023-04-12 NOTE — Progress Notes (Signed)
 Assessment & Plan:  Gao was seen today for medical management of chronic issues.  Diagnoses and all orders for this visit:  Prediabetes -     Hemoglobin A1c -     CMP14+EGFR Recommendations: DASH/Mediterranean diet and continue exercising 3-5 times per week at least 30 minutes.   Education on Prediabetes on AVS  Dyslipidemia, goal LDL below 100 -     atorvastatin (LIPITOR) 20 MG tablet; Take 1 tablet (20 mg total) by mouth daily. -     Lipid Panel -     CMP14+EGFR Continue taking medication to help lower cholesterol  Chronic left-sided thoracic back pain -     diclofenac (VOLTAREN) 75 MG EC tablet; Take 1 tablet (75 mg total) by mouth 2 (two) times daily as needed. For back pain -     DULoxetine (CYMBALTA) 30 MG capsule; Take 1 capsule (30 mg total) by mouth at bedtime. Continue follow up with Orthopedic Specialist.  Continue Physical Therapy as recommended by Orthopedic Specialist    Patient has been counseled on age-appropriate routine health concerns for screening and prevention. These are reviewed and up-to-date. Referrals have been placed accordingly. Immunizations are up-to-date or declined.    Subjective:   Chief Complaint  Patient presents with  . Medical Management of Chronic Issues    Kristin [name] 48 y.o. female presents to office today presents for follow up today on prediabetes. Hemoglobin A1c 6.0 on visit 09/16/2022. Reports she has decreased her carbohydrate intake and eating more protein and vegetables. Discussed DASH/Mediterranean diet and continue exercising 3-5 times per week at least 30 minutes. Will recheck Hemoglobin A1c today. Blood pressure is well controlled.  BP Readings from Last 3 Encounters: 04/12/23 : 122/82 09/16/22 : 130/83 08/25/22 : 125/86           Review of Systems  Constitutional: Negative.   HENT: Negative.    Eyes: Negative.   Respiratory: Negative.    Cardiovascular: Negative.   Gastrointestinal: Negative.    Musculoskeletal:  Positive for back pain.  Skin: Negative.   Neurological: Negative.   Endo/Heme/Allergies: Negative.   Psychiatric/Behavioral: Negative.      Past Medical History:  Diagnosis Date  . Ectopic pregnancy   . Nephrolithiasis     Past Surgical History:  Procedure Laterality Date  . etopic pregnancy      Family History  Problem Relation Age of Onset  . Breast cancer Neg Hx     Social History Reviewed with no changes to be made today.   Outpatient Medications Prior to Visit  Medication Sig Dispense Refill  . atorvastatin (LIPITOR) 20 MG tablet Take 1 tablet (20 mg total) by mouth daily. 90 tablet 3  . diclofenac (VOLTAREN) 75 MG EC tablet Take 1 tablet (75 mg total) by mouth 2 (two) times daily as needed. For back pain 60 tablet 0  . DULoxetine (CYMBALTA) 30 MG capsule Take 1 capsule (30 mg total) by mouth at bedtime. 30 capsule 0  . moxifloxacin (VIGAMOX) 0.5 % ophthalmic solution Place 1 drop into the right eye 3 (three) times daily. (Patient not taking: Reported on 04/12/2023) 3 mL 0   No facility-administered medications prior to visit.    Allergies  Allergen Reactions  . Wellbutrin [Bupropion] Rash       Objective:    BP 122/82 (BP Location: Left Arm, Patient Position: Sitting, Cuff Size: Large)   Pulse (!) 103   Resp 20   Ht 5\' 3"  (1.6 m)   Wt  240 lb (108.9 kg)   LMP 03/21/2023 (Approximate)   SpO2 100%   BMI 42.51 kg/m  Wt Readings from Last 3 Encounters:  04/12/23 240 lb (108.9 kg)  09/16/22 229 lb 9.6 oz (104.1 kg)  08/25/22 229 lb 9.6 oz (104.1 kg)   Physical Exam Vitals and nursing note reviewed.  Constitutional:      Appearance: Normal appearance. She is obese.  HENT:     Head: Normocephalic.  Cardiovascular:     Rate and Rhythm: Normal rate and regular rhythm.     Heart sounds: Normal heart sounds.  Pulmonary:     Effort: Pulmonary effort is normal.     Breath sounds: Normal breath sounds.  Musculoskeletal:        General:  Normal range of motion.     Cervical back: Normal and normal range of motion.     Thoracic back: Normal.     Lumbar back: Tenderness present.  Skin:    General: Skin is warm and dry.  Neurological:     General: No focal deficit present.     Mental Status: She is alert and oriented to person, place, and time.  Psychiatric:        Attention and Perception: Attention normal.        Mood and Affect: Mood normal.        Speech: Speech normal.        Behavior: Behavior normal.        Thought Content: Thought content normal.        Cognition and Memory: Cognition normal.        Judgment: Judgment normal.         Patient has been counseled extensively about nutrition and exercise as well as the importance of adherence with medications and regular follow-up. The patient was given clear instructions to go to ER or return to medical center if symptoms don't improve, worsen or new problems develop. The patient verbalized understanding.   Follow-up: Return in about 6 months (around 10/12/2023).   Joette Catching, BSN RN,  FNP student [facility] [place], [place] [phone]   04/12/2023, 11:21 AM

## 2023-04-12 NOTE — Progress Notes (Signed)
 I have seen and examined this patient with the advanced practice provider student and agree with the note below

## 2023-04-13 LAB — LIPID PANEL
Chol/HDL Ratio: 4.7 ratio — ABNORMAL HIGH (ref 0.0–4.4)
Cholesterol, Total: 226 mg/dL — ABNORMAL HIGH (ref 100–199)
HDL: 48 mg/dL (ref >39)
LDL Chol Calc (NIH): 159 mg/dL — ABNORMAL HIGH (ref 0–99)
Triglycerides: 106 mg/dL (ref 0–149)
VLDL Cholesterol Cal: 19 mg/dL (ref 5–40)

## 2023-04-13 LAB — CMP14+EGFR
ALT: 20 IU/L (ref 0–32)
AST: 19 IU/L (ref 0–40)
Albumin: 4.4 g/dL (ref 3.9–4.9)
Alkaline Phosphatase: 90 IU/L (ref 44–121)
BUN/Creatinine Ratio: 19 (ref 9–23)
BUN: 14 mg/dL (ref 6–24)
Bilirubin Total: 0.5 mg/dL (ref 0.0–1.2)
CO2: 21 mmol/L (ref 20–29)
Calcium: 9.8 mg/dL (ref 8.7–10.2)
Chloride: 102 mmol/L (ref 96–106)
Creatinine, Ser: 0.74 mg/dL (ref 0.57–1.00)
Globulin, Total: 3.2 g/dL (ref 1.5–4.5)
Glucose: 124 mg/dL — ABNORMAL HIGH (ref 70–99)
Potassium: 4.6 mmol/L (ref 3.5–5.2)
Sodium: 139 mmol/L (ref 134–144)
Total Protein: 7.6 g/dL (ref 6.0–8.5)
eGFR: 99 mL/min/{1.73_m2} (ref >59)

## 2023-04-13 LAB — HEMOGLOBIN A1C
Est. average glucose Bld gHb Est-mCnc: 137 mg/dL
Hgb A1c MFr Bld: 6.4 % — ABNORMAL HIGH (ref 4.8–5.6)

## 2023-04-17 ENCOUNTER — Encounter: Payer: Self-pay | Admitting: Nurse Practitioner

## 2023-04-24 ENCOUNTER — Other Ambulatory Visit: Payer: Self-pay

## 2023-07-26 DIAGNOSIS — M9915 Subluxation complex (vertebral) of pelvic region: Secondary | ICD-10-CM | POA: Diagnosis not present

## 2023-07-26 DIAGNOSIS — M9914 Subluxation complex (vertebral) of sacral region: Secondary | ICD-10-CM | POA: Diagnosis not present

## 2023-07-26 DIAGNOSIS — M9912 Subluxation complex (vertebral) of thoracic region: Secondary | ICD-10-CM | POA: Diagnosis not present

## 2023-07-26 DIAGNOSIS — M9913 Subluxation complex (vertebral) of lumbar region: Secondary | ICD-10-CM | POA: Diagnosis not present

## 2023-08-02 ENCOUNTER — Other Ambulatory Visit: Payer: Self-pay

## 2023-08-02 DIAGNOSIS — M9915 Subluxation complex (vertebral) of pelvic region: Secondary | ICD-10-CM | POA: Diagnosis not present

## 2023-08-02 DIAGNOSIS — M9913 Subluxation complex (vertebral) of lumbar region: Secondary | ICD-10-CM | POA: Diagnosis not present

## 2023-08-02 DIAGNOSIS — M9912 Subluxation complex (vertebral) of thoracic region: Secondary | ICD-10-CM | POA: Diagnosis not present

## 2023-08-02 DIAGNOSIS — M9914 Subluxation complex (vertebral) of sacral region: Secondary | ICD-10-CM | POA: Diagnosis not present

## 2023-08-09 DIAGNOSIS — M9915 Subluxation complex (vertebral) of pelvic region: Secondary | ICD-10-CM | POA: Diagnosis not present

## 2023-08-09 DIAGNOSIS — M9913 Subluxation complex (vertebral) of lumbar region: Secondary | ICD-10-CM | POA: Diagnosis not present

## 2023-08-09 DIAGNOSIS — M9912 Subluxation complex (vertebral) of thoracic region: Secondary | ICD-10-CM | POA: Diagnosis not present

## 2023-08-09 DIAGNOSIS — M9914 Subluxation complex (vertebral) of sacral region: Secondary | ICD-10-CM | POA: Diagnosis not present

## 2023-08-16 DIAGNOSIS — M9915 Subluxation complex (vertebral) of pelvic region: Secondary | ICD-10-CM | POA: Diagnosis not present

## 2023-08-16 DIAGNOSIS — M9913 Subluxation complex (vertebral) of lumbar region: Secondary | ICD-10-CM | POA: Diagnosis not present

## 2023-08-16 DIAGNOSIS — M9912 Subluxation complex (vertebral) of thoracic region: Secondary | ICD-10-CM | POA: Diagnosis not present

## 2023-08-16 DIAGNOSIS — M9914 Subluxation complex (vertebral) of sacral region: Secondary | ICD-10-CM | POA: Diagnosis not present

## 2023-09-06 DIAGNOSIS — M9914 Subluxation complex (vertebral) of sacral region: Secondary | ICD-10-CM | POA: Diagnosis not present

## 2023-09-06 DIAGNOSIS — M9915 Subluxation complex (vertebral) of pelvic region: Secondary | ICD-10-CM | POA: Diagnosis not present

## 2023-09-06 DIAGNOSIS — M9913 Subluxation complex (vertebral) of lumbar region: Secondary | ICD-10-CM | POA: Diagnosis not present

## 2023-09-06 DIAGNOSIS — M9912 Subluxation complex (vertebral) of thoracic region: Secondary | ICD-10-CM | POA: Diagnosis not present

## 2023-09-20 DIAGNOSIS — M9913 Subluxation complex (vertebral) of lumbar region: Secondary | ICD-10-CM | POA: Diagnosis not present

## 2023-09-20 DIAGNOSIS — M9914 Subluxation complex (vertebral) of sacral region: Secondary | ICD-10-CM | POA: Diagnosis not present

## 2023-09-20 DIAGNOSIS — M9912 Subluxation complex (vertebral) of thoracic region: Secondary | ICD-10-CM | POA: Diagnosis not present

## 2023-09-20 DIAGNOSIS — M9915 Subluxation complex (vertebral) of pelvic region: Secondary | ICD-10-CM | POA: Diagnosis not present

## 2023-09-27 DIAGNOSIS — M9913 Subluxation complex (vertebral) of lumbar region: Secondary | ICD-10-CM | POA: Diagnosis not present

## 2023-09-27 DIAGNOSIS — M9914 Subluxation complex (vertebral) of sacral region: Secondary | ICD-10-CM | POA: Diagnosis not present

## 2023-09-27 DIAGNOSIS — M9912 Subluxation complex (vertebral) of thoracic region: Secondary | ICD-10-CM | POA: Diagnosis not present

## 2023-09-27 DIAGNOSIS — M9915 Subluxation complex (vertebral) of pelvic region: Secondary | ICD-10-CM | POA: Diagnosis not present

## 2023-10-11 ENCOUNTER — Other Ambulatory Visit: Payer: Self-pay

## 2023-10-11 DIAGNOSIS — M9914 Subluxation complex (vertebral) of sacral region: Secondary | ICD-10-CM | POA: Diagnosis not present

## 2023-10-11 DIAGNOSIS — M9913 Subluxation complex (vertebral) of lumbar region: Secondary | ICD-10-CM | POA: Diagnosis not present

## 2023-10-11 DIAGNOSIS — M9915 Subluxation complex (vertebral) of pelvic region: Secondary | ICD-10-CM | POA: Diagnosis not present

## 2023-10-11 DIAGNOSIS — M9912 Subluxation complex (vertebral) of thoracic region: Secondary | ICD-10-CM | POA: Diagnosis not present

## 2023-10-18 ENCOUNTER — Ambulatory Visit: Payer: Self-pay | Admitting: Nurse Practitioner

## 2023-11-01 ENCOUNTER — Other Ambulatory Visit: Payer: Self-pay

## 2023-11-01 ENCOUNTER — Ambulatory Visit: Attending: Nurse Practitioner | Admitting: Nurse Practitioner

## 2023-11-01 ENCOUNTER — Encounter: Payer: Self-pay | Admitting: Nurse Practitioner

## 2023-11-01 VITALS — BP 139/84 | Resp 19 | Ht 63.0 in | Wt 229.6 lb

## 2023-11-01 DIAGNOSIS — Z1211 Encounter for screening for malignant neoplasm of colon: Secondary | ICD-10-CM

## 2023-11-01 DIAGNOSIS — Z1231 Encounter for screening mammogram for malignant neoplasm of breast: Secondary | ICD-10-CM

## 2023-11-01 DIAGNOSIS — E7800 Pure hypercholesterolemia, unspecified: Secondary | ICD-10-CM

## 2023-11-01 DIAGNOSIS — R7303 Prediabetes: Secondary | ICD-10-CM | POA: Diagnosis not present

## 2023-11-01 DIAGNOSIS — D72829 Elevated white blood cell count, unspecified: Secondary | ICD-10-CM | POA: Diagnosis not present

## 2023-11-01 LAB — POCT GLYCOSYLATED HEMOGLOBIN (HGB A1C): HbA1c, POC (prediabetic range): 5.9 % (ref 5.7–6.4)

## 2023-11-01 NOTE — Patient Instructions (Signed)
 [facility] Located in: [facility] Address: [address], [place], [place] [address] Phone: [phone]

## 2023-11-01 NOTE — Progress Notes (Signed)
 Assessment & Plan:  Kristin [name] was seen today for prediabetes.  Diagnoses and all orders for this visit:  Prediabetes -     CMP14+EGFR -     POCT glycosylated hemoglobin (Hb A1C)  Hypercholesterolemia -     Lipid panel INSTRUCTIONS: Work on a low fat, heart healthy diet and participate in regular aerobic exercise program by working out at least 150 minutes per week; 5 days a week-30 minutes per day. Avoid red meat/beef/steak,  fried foods. junk foods, sodas, sugary drinks, unhealthy snacking, alcohol and smoking.  Drink at least 80 oz of water per day and monitor your carbohydrate intake daily.    Leukocytosis, unspecified type -     CBC with Differential/Platelet  Breast cancer screening by mammogram -     MM 3D SCREENING MAMMOGRAM BILATERAL BREAST; Future  Colon cancer screening -     Ambulatory referral to Gastroenterology    Patient has been counseled on age-appropriate routine health concerns for screening and prevention. These are reviewed and up-to-date. Referrals have been placed accordingly. Immunizations are up-to-date or declined.    Subjective:   Chief Complaint  Patient presents with   Prediabetes    Kristin [name] 48 y.o. female presents to office today for follow up to prediabetes  She has a past medical history of chronic back pain, chronic hip, ectopic pregnancy and Nephrolithiasis.    Prediabetes A1c improved from last visit. Down from 6.4 to 5..9 She is currently taking tirzepatide through an  outside vendor.  Lab Results  Component Value Date   HGBA1C 5.9 11/01/2023      She is not taking a statin at this time.  The 10-year ASCVD risk score (Arnett DK, et al., 2019) is: 1.9%   Values used to calculate the score:     Age: 70 years     Clincally relevant sex: Female     Is Non-Hispanic African American: No     Diabetic: No     Tobacco smoker: No     Systolic Blood Pressure: 139 mmHg     Is BP treated: No     HDL Cholesterol: 48  mg/dL     Total Cholesterol: 226 mg/dL   Review of Systems  Constitutional:  Negative for fever, malaise/fatigue and weight loss.  HENT: Negative.  Negative for nosebleeds.   Eyes: Negative.  Negative for blurred vision, double vision and photophobia.  Respiratory: Negative.  Negative for cough and shortness of breath.   Cardiovascular: Negative.  Negative for chest pain, palpitations and leg swelling.  Gastrointestinal: Negative.  Negative for heartburn, nausea and vomiting.  Musculoskeletal: Negative.  Negative for myalgias.  Neurological: Negative.  Negative for dizziness, focal weakness, seizures and headaches.  Psychiatric/Behavioral: Negative.  Negative for suicidal ideas.     Past Medical History:  Diagnosis Date   Ectopic pregnancy    Nephrolithiasis     Past Surgical History:  Procedure Laterality Date   etopic pregnancy      Family History  Problem Relation Age of Onset   Breast cancer Neg Hx     Social History Reviewed with no changes to be made today.   Outpatient Medications Prior to Visit  Medication Sig Dispense Refill   diclofenac  (VOLTAREN ) 75 MG EC tablet Take 1 tablet (75 mg total) by mouth 2 (two) times daily as needed. For back pain 60 tablet 1   DULoxetine  (CYMBALTA ) 30 MG capsule Take 1 capsule (30 mg total) by mouth at bedtime. 90 capsule  1   atorvastatin  (LIPITOR) 20 MG tablet Take 1 tablet (20 mg total) by mouth daily. (Patient not taking: Reported on 11/01/2023) 90 tablet 3   moxifloxacin  (VIGAMOX ) 0.5 % ophthalmic solution Place 1 drop into the right eye 3 (three) times daily. (Patient not taking: Reported on 11/01/2023) 3 mL 0   No facility-administered medications prior to visit.    Allergies  Allergen Reactions   Wellbutrin  [Bupropion ] Rash       Objective:    BP 139/84 (BP Location: Left Arm, Patient Position: Sitting, Cuff Size: Normal)   Resp 19   Ht 5' 3 (1.6 m)   Wt 229 lb 9.6 oz (104.1 kg)   LMP 09/28/2023 (Approximate)    SpO2 98%   BMI 40.67 kg/m  Wt Readings from Last 3 Encounters:  11/01/23 229 lb 9.6 oz (104.1 kg)  04/12/23 240 lb (108.9 kg)  09/16/22 229 lb 9.6 oz (104.1 kg)    Physical Exam Vitals and nursing note reviewed.  Constitutional:      Appearance: She is well-developed.  HENT:     Head: Normocephalic and atraumatic.  Cardiovascular:     Rate and Rhythm: Normal rate and regular rhythm.     Heart sounds: Normal heart sounds. No murmur heard.    No friction rub. No gallop.  Pulmonary:     Effort: Pulmonary effort is normal. No tachypnea or respiratory distress.     Breath sounds: Normal breath sounds. No decreased breath sounds, wheezing, rhonchi or rales.  Chest:     Chest wall: No tenderness.  Musculoskeletal:        General: Normal range of motion.     Cervical back: Normal range of motion.  Skin:    General: Skin is warm and dry.  Neurological:     Mental Status: She is alert and oriented to person, place, and time.     Coordination: Coordination normal.  Psychiatric:        Behavior: Behavior normal. Behavior is cooperative.        Thought Content: Thought content normal.        Judgment: Judgment normal.          Patient has been counseled extensively about nutrition and exercise as well as the importance of adherence with medications and regular follow-up. The patient was given clear instructions to go to ER or return to medical center if symptoms don't improve, worsen or new problems develop. The patient verbalized understanding.   Follow-up: Return in about 6 months (around 05/01/2024) for pap smear.   Haze LELON Servant, FNP-BC [facility] [place], [place] [phone]   11/01/2023, 4:56 PM

## 2023-11-02 ENCOUNTER — Ambulatory Visit: Payer: Self-pay | Admitting: Nurse Practitioner

## 2023-11-02 LAB — CMP14+EGFR
ALT: 21 IU/L (ref 0–32)
AST: 20 IU/L (ref 0–40)
Albumin: 4.6 g/dL (ref 3.9–4.9)
Alkaline Phosphatase: 92 IU/L (ref 41–116)
BUN/Creatinine Ratio: 20 (ref 9–23)
BUN: 11 mg/dL (ref 6–24)
Bilirubin Total: 0.4 mg/dL (ref 0.0–1.2)
CO2: 22 mmol/L (ref 20–29)
Calcium: 9.8 mg/dL (ref 8.7–10.2)
Chloride: 103 mmol/L (ref 96–106)
Creatinine, Ser: 0.55 mg/dL — ABNORMAL LOW (ref 0.57–1.00)
Globulin, Total: 3.2 g/dL (ref 1.5–4.5)
Glucose: 90 mg/dL (ref 70–99)
Potassium: 4.3 mmol/L (ref 3.5–5.2)
Sodium: 140 mmol/L (ref 134–144)
Total Protein: 7.8 g/dL (ref 6.0–8.5)
eGFR: 112 mL/min/1.73 (ref >59)

## 2023-11-02 LAB — CBC WITH DIFFERENTIAL/PLATELET
Basophils Absolute: 0 x10E3/uL (ref 0.0–0.2)
Basos: 0 %
EOS (ABSOLUTE): 0.2 x10E3/uL (ref 0.0–0.4)
Eos: 2 %
Hematocrit: 45.3 % (ref 34.0–46.6)
Hemoglobin: 14.3 g/dL (ref 11.1–15.9)
Immature Grans (Abs): 0 x10E3/uL (ref 0.0–0.1)
Immature Granulocytes: 0 %
Lymphocytes Absolute: 3.9 x10E3/uL — ABNORMAL HIGH (ref 0.7–3.1)
Lymphs: 39 %
MCH: 27.9 pg (ref 26.6–33.0)
MCHC: 31.6 g/dL (ref 31.5–35.7)
MCV: 88 fL (ref 79–97)
Monocytes Absolute: 0.6 x10E3/uL (ref 0.1–0.9)
Monocytes: 6 %
Neutrophils Absolute: 5.4 x10E3/uL (ref 1.4–7.0)
Neutrophils: 53 %
Platelets: 275 x10E3/uL (ref 150–450)
RBC: 5.13 x10E6/uL (ref 3.77–5.28)
RDW: 13.8 % (ref 11.7–15.4)
WBC: 10.1 x10E3/uL (ref 3.4–10.8)

## 2023-11-02 LAB — LIPID PANEL
Chol/HDL Ratio: 4.5 ratio — ABNORMAL HIGH (ref 0.0–4.4)
Cholesterol, Total: 224 mg/dL — ABNORMAL HIGH (ref 100–199)
HDL: 50 mg/dL (ref >39)
LDL Chol Calc (NIH): 161 mg/dL — ABNORMAL HIGH (ref 0–99)
Triglycerides: 76 mg/dL (ref 0–149)
VLDL Cholesterol Cal: 13 mg/dL (ref 5–40)

## 2023-11-08 DIAGNOSIS — M9915 Subluxation complex (vertebral) of pelvic region: Secondary | ICD-10-CM | POA: Diagnosis not present

## 2023-11-08 DIAGNOSIS — M9913 Subluxation complex (vertebral) of lumbar region: Secondary | ICD-10-CM | POA: Diagnosis not present

## 2023-11-08 DIAGNOSIS — M9914 Subluxation complex (vertebral) of sacral region: Secondary | ICD-10-CM | POA: Diagnosis not present

## 2023-11-08 DIAGNOSIS — M9912 Subluxation complex (vertebral) of thoracic region: Secondary | ICD-10-CM | POA: Diagnosis not present

## 2023-11-14 DIAGNOSIS — H5213 Myopia, bilateral: Secondary | ICD-10-CM | POA: Diagnosis not present

## 2023-11-22 ENCOUNTER — Ambulatory Visit
Admission: RE | Admit: 2023-11-22 | Discharge: 2023-11-22 | Disposition: A | Discharge status: Home / Self Care | Source: Ambulatory Visit | Attending: Nurse Practitioner | Admitting: Nurse Practitioner

## 2023-11-22 DIAGNOSIS — Z1231 Encounter for screening mammogram for malignant neoplasm of breast: Secondary | ICD-10-CM | POA: Diagnosis not present

## 2023-12-28 ENCOUNTER — Other Ambulatory Visit: Payer: Self-pay

## 2024-01-09 ENCOUNTER — Encounter: Payer: Self-pay | Admitting: Internal Medicine

## 2024-05-01 ENCOUNTER — Ambulatory Visit: Admitting: Nurse Practitioner
